# Patient Record
Sex: Male | Born: 1975 | Race: Black or African American | Hispanic: No | Marital: Single | Smoking: Former smoker
Health system: Southern US, Community
[De-identification: ages and names within clinical notes are randomized; demographics above are authoritative.]

## PROBLEM LIST (undated history)

## (undated) DIAGNOSIS — D649 Anemia, unspecified: Secondary | ICD-10-CM

## (undated) HISTORY — DX: Anemia, unspecified: D64.9

---

## 1999-03-15 ENCOUNTER — Emergency Department (HOSPITAL_COMMUNITY): Admission: EM | Admit: 1999-03-15 | Discharge: 1999-03-15 | Payer: Self-pay | Admitting: Emergency Medicine

## 1999-05-14 ENCOUNTER — Emergency Department (HOSPITAL_COMMUNITY): Admission: EM | Admit: 1999-05-14 | Discharge: 1999-05-14 | Payer: Self-pay | Admitting: Emergency Medicine

## 2002-01-06 ENCOUNTER — Encounter: Payer: Self-pay | Admitting: *Deleted

## 2002-01-06 ENCOUNTER — Emergency Department (HOSPITAL_COMMUNITY): Admission: EM | Admit: 2002-01-06 | Discharge: 2002-01-06 | Payer: Self-pay | Admitting: Emergency Medicine

## 2016-05-02 ENCOUNTER — Emergency Department (HOSPITAL_COMMUNITY)
Admission: EM | Admit: 2016-05-02 | Discharge: 2016-05-02 | Disposition: A | Discharge status: Home / Self Care | Payer: BLUE CROSS/BLUE SHIELD | Attending: Emergency Medicine | Admitting: Emergency Medicine

## 2016-05-02 ENCOUNTER — Encounter (HOSPITAL_COMMUNITY): Payer: Self-pay | Admitting: Emergency Medicine

## 2016-05-02 DIAGNOSIS — M79672 Pain in left foot: Secondary | ICD-10-CM

## 2016-05-02 MED ORDER — OXYCODONE-ACETAMINOPHEN 5-325 MG PO TABS: 2.0000 | ORAL_TABLET | ORAL | 0 refills | Status: DC | PRN

## 2016-05-02 MED ORDER — NAPROXEN 500 MG PO TABS: 500.0000 mg | ORAL_TABLET | Freq: Two times a day (BID) | ORAL | 0 refills | Status: DC

## 2016-05-02 NOTE — ED Notes (Addendum)
Patient was alert, oriented and stable upon discharge. RN went over AVS and patient had no further questions. Patient was alert, oriented and stable upon discharge. RN went over AVS and patient had no further questions.  

## 2016-05-02 NOTE — ED Provider Notes (Signed)
[facility] DEPT Provider Note   CSN: [id number] Arrival date & time: 05/02/16  1658   By signing my name below, I, Avnee Patel, attest that this documentation has been prepared under the direction and in the presence of  [employer]. Electronically Signed: Delton Prairie, ED Scribe. 05/02/16. 6:20 PM.   History   Chief Complaint Chief Complaint  Patient presents with  . Foot Pain   The history is provided by the patient. No language interpreter was used.   HPI Comments:  Calvin Andrews is a 41 y.o. male who presents to the Emergency Department complaining of atraumatic, acute onset outer left foot pain onset in the AM today. His pain is worse upon palpation and putting weight on it. 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 He also reports a hx of similar symptoms in the past. No alleviating factors noted. Pt denies any redness, numbness/tingling to his toes, any injuries to the area, previous surgeries to the foot. No other associated symptoms noted.   History reviewed. No pertinent past medical history.  There are no active problems to display for this patient.   No past surgical history on file.   Home Medications    Prior to Admission medications   Medication Sig Start Date End Date Taking? Authorizing Provider  naproxen (NAPROSYN) 500 MG tablet Take 1 tablet (500 mg total) by mouth 2 (two) times daily. 05/02/16   Kinnie Feil, PA-C  oxyCODONE-acetaminophen (PERCOCET/ROXICET) 5-325 MG tablet Take 2 tablets by mouth every 4 (four) hours as needed for severe pain. 05/02/16   Kinnie Feil, PA-C    Family History History reviewed. No pertinent family history.  Social History Social History  Substance Use Topics  . Smoking status: Not on file  . Smokeless tobacco: Not on file  . Alcohol use Not on file     Allergies   Patient has no known allergies.   Review of Systems Review of Systems  Constitutional:  Negative for chills and fever.  HENT: Negative for congestion and sore throat.   Eyes: Negative for visual disturbance.  Respiratory: Negative for cough, chest tightness and shortness of breath.   Cardiovascular: Negative for chest pain.  Gastrointestinal: Negative for abdominal pain, constipation, diarrhea, nausea and vomiting.  Genitourinary: Negative for decreased urine volume and difficulty urinating.  Musculoskeletal: Negative for arthralgias and joint swelling.  Skin: Negative for rash.  Neurological: Negative for dizziness, light-headedness and headaches.     Physical Exam Updated Vital Signs BP 126/88   Pulse 100   Temp 99.6 F (37.6 C) (Oral)   Resp 16   Ht _0  (1.753 m)   Wt 77.1 kg   SpO2 96%   BMI 25.10 kg/m   Physical Exam  Constitutional: He is oriented to person, place, and time. He appears well-developed and well-nourished. No distress.  NAD.  HENT:  Head: Normocephalic and atraumatic.  Right Ear: External ear normal.  Left Ear: External ear normal.  Nose: Nose normal.  Mouth/Throat: Oropharynx is clear and moist. No oropharyngeal exudate.  Eyes: Conjunctivae and EOM are normal. Pupils are equal, round, and reactive to light. No scleral icterus.  Neck: Normal range of motion. Neck supple. No JVD present. No tracheal deviation present.  Cardiovascular: Normal rate, regular rhythm, normal heart sounds and intact distal pulses.   No murmur heard. Pulmonary/Chest: Effort normal and breath sounds normal. He has no wheezes.  Abdominal: Soft. He exhibits no  distension. There is no tenderness.  Musculoskeletal: Normal range of motion. He exhibits edema and tenderness. He exhibits no deformity.  Left foot: Mild edema and tenderness on the lateral aspect of the left mid foot most significant over CTL and ATFL. Positive talar tilt. Non tender Achilles tender, negative Thompson's. Negative anterior/posterior drawer. No calf tenderness. No unilateral lower extremity  edema.   Lymphadenopathy:    He has no cervical adenopathy.  Neurological: He is alert and oriented to person, place, and time.  Foot: sensation to light touch intact in the distribution of the saphenous nerve, medial plantar nerve, lateral plantar nerve, bilaterally.    Skin: Skin is warm and dry. Capillary refill takes less than 2 seconds.  Psychiatric: He has a normal mood and affect. His behavior is normal. Judgment and thought content normal.  Nursing note and vitals reviewed.  ED Treatments / Results  DIAGNOSTIC STUDIES:  Oxygen Saturation is 96% on RA, normal by my interpretation.    COORDINATION OF CARE:  6:07 PM Discussed treatment plan with pt at bedside and pt agreed to plan.  Labs (all labs ordered are listed, but only abnormal results are displayed) Labs Reviewed - No data to display  EKG  EKG Interpretation None       Radiology No results found.  Procedures Procedures (including critical care time)  Medications Ordered in ED Medications - No data to display   Initial Impression / Assessment and Plan / ED Course  I have reviewed the triage vital signs and the nursing notes.  Pertinent labs & imaging results that were available during my care of the patient were reviewed by me and considered in my medical decision making (see chart for details).     Patient present to the ED with atraumatic left foot pain. [place] ankle rules not met, x-ray not indicated.  Suspect ligamentous injury, possibly from overuse.  NV intact.  Pt [place] airport.  Patient given brace while in ED, prescribed short course of Percocet for severe night pain and conservative therapy recommended and discussed. Patient will be discharged home & is agreeable with above plan. Returns precautions discussed. Pt appears safe for discharge.  Final Clinical Impressions(s) / ED Diagnoses   Final diagnoses:  Left foot pain    New Prescriptions Discharge Medication  List as of 05/02/2016  6:33 PM    START taking these medications   Details  naproxen (NAPROSYN) 500 MG tablet Take 1 tablet (500 mg total) by mouth 2 (two) times daily., Starting Mon 05/02/2016, Print    oxyCODONE-acetaminophen (PERCOCET/ROXICET) 5-325 MG tablet Take 2 tablets by mouth every 4 (four) hours as needed for severe pain., Starting Mon 05/02/2016, Print       I personally performed the services described in this documentation, which was scribed in my presence. The recorded information has been reviewed and is accurate.     Kinnie Feil, PA-C 05/02/16 [place], MD 05/03/16 [phone]

## 2016-05-02 NOTE — ED Notes (Signed)
ED Provider at bedside. 

## 2016-05-02 NOTE — Discharge Instructions (Signed)
Please wear your ankle wrap to provide support and decrease swelling.  Make sure you wear adequate shoes to work to prevent further injury to your ligaments.    Please elevate and ice your foot to decrease inflammation, which will decrease pain.  Please take naproxen 500mg  with 650 tylenol 3 times a day for pain and inflammation.  You have a short prescription of percocet for severe pain, you may reserve this for night time as it may cause drowsiness.   Follow up with primary care doctor if your symptoms do not improve or worsen

## 2016-05-02 NOTE — ED Triage Notes (Signed)
Pt states that he woke up this morning with L foot pain. Denies injury. Walks often. Alert and oriented.

## 2016-10-20 DIAGNOSIS — Z813 Family history of other psychoactive substance abuse and dependence: Secondary | ICD-10-CM | POA: Diagnosis not present

## 2016-10-20 DIAGNOSIS — F419 Anxiety disorder, unspecified: Secondary | ICD-10-CM | POA: Diagnosis not present

## 2016-10-20 DIAGNOSIS — F6381 Intermittent explosive disorder: Secondary | ICD-10-CM | POA: Diagnosis not present

## 2016-10-20 DIAGNOSIS — F332 Major depressive disorder, recurrent severe without psychotic features: Secondary | ICD-10-CM | POA: Diagnosis not present

## 2016-10-20 DIAGNOSIS — Z79899 Other long term (current) drug therapy: Secondary | ICD-10-CM | POA: Diagnosis not present

## 2016-10-20 DIAGNOSIS — F333 Major depressive disorder, recurrent, severe with psychotic symptoms: Secondary | ICD-10-CM | POA: Diagnosis not present

## 2016-10-20 DIAGNOSIS — G4700 Insomnia, unspecified: Secondary | ICD-10-CM | POA: Diagnosis not present

## 2016-10-20 DIAGNOSIS — F329 Major depressive disorder, single episode, unspecified: Secondary | ICD-10-CM | POA: Diagnosis not present

## 2016-10-20 DIAGNOSIS — R45851 Suicidal ideations: Secondary | ICD-10-CM | POA: Diagnosis not present

## 2016-10-20 DIAGNOSIS — Z87891 Personal history of nicotine dependence: Secondary | ICD-10-CM | POA: Diagnosis not present

## 2016-10-20 DIAGNOSIS — F323 Major depressive disorder, single episode, severe with psychotic features: Secondary | ICD-10-CM | POA: Diagnosis not present

## 2016-10-21 DIAGNOSIS — F323 Major depressive disorder, single episode, severe with psychotic features: Secondary | ICD-10-CM | POA: Diagnosis not present

## 2016-10-21 DIAGNOSIS — F6381 Intermittent explosive disorder: Secondary | ICD-10-CM | POA: Diagnosis not present

## 2016-10-21 DIAGNOSIS — Z813 Family history of other psychoactive substance abuse and dependence: Secondary | ICD-10-CM | POA: Diagnosis not present

## 2016-10-21 DIAGNOSIS — R45851 Suicidal ideations: Secondary | ICD-10-CM | POA: Diagnosis not present

## 2016-10-22 DIAGNOSIS — F323 Major depressive disorder, single episode, severe with psychotic features: Secondary | ICD-10-CM | POA: Diagnosis not present

## 2016-10-22 DIAGNOSIS — F6381 Intermittent explosive disorder: Secondary | ICD-10-CM | POA: Diagnosis not present

## 2016-10-22 DIAGNOSIS — Z87891 Personal history of nicotine dependence: Secondary | ICD-10-CM | POA: Diagnosis not present

## 2016-10-23 DIAGNOSIS — F323 Major depressive disorder, single episode, severe with psychotic features: Secondary | ICD-10-CM | POA: Diagnosis not present

## 2016-10-23 DIAGNOSIS — Z87891 Personal history of nicotine dependence: Secondary | ICD-10-CM | POA: Diagnosis not present

## 2016-10-23 DIAGNOSIS — F6381 Intermittent explosive disorder: Secondary | ICD-10-CM | POA: Diagnosis not present

## 2016-10-24 DIAGNOSIS — Z87891 Personal history of nicotine dependence: Secondary | ICD-10-CM | POA: Diagnosis not present

## 2016-10-24 DIAGNOSIS — F419 Anxiety disorder, unspecified: Secondary | ICD-10-CM | POA: Diagnosis not present

## 2016-10-24 DIAGNOSIS — F323 Major depressive disorder, single episode, severe with psychotic features: Secondary | ICD-10-CM | POA: Diagnosis not present

## 2016-10-24 DIAGNOSIS — F6381 Intermittent explosive disorder: Secondary | ICD-10-CM | POA: Diagnosis not present

## 2016-10-25 DIAGNOSIS — F6381 Intermittent explosive disorder: Secondary | ICD-10-CM | POA: Diagnosis not present

## 2016-10-25 DIAGNOSIS — F323 Major depressive disorder, single episode, severe with psychotic features: Secondary | ICD-10-CM | POA: Diagnosis not present

## 2016-10-25 DIAGNOSIS — F419 Anxiety disorder, unspecified: Secondary | ICD-10-CM | POA: Diagnosis not present

## 2016-10-25 DIAGNOSIS — Z87891 Personal history of nicotine dependence: Secondary | ICD-10-CM | POA: Diagnosis not present

## 2016-12-22 ENCOUNTER — Emergency Department (HOSPITAL_BASED_OUTPATIENT_CLINIC_OR_DEPARTMENT_OTHER)
Admission: EM | Admit: 2016-12-22 | Discharge: 2016-12-22 | Disposition: A | Discharge status: Home / Self Care | Payer: BLUE CROSS/BLUE SHIELD | Attending: Emergency Medicine | Admitting: Emergency Medicine

## 2016-12-22 ENCOUNTER — Encounter (HOSPITAL_BASED_OUTPATIENT_CLINIC_OR_DEPARTMENT_OTHER): Payer: Self-pay | Admitting: *Deleted

## 2016-12-22 ENCOUNTER — Emergency Department (HOSPITAL_BASED_OUTPATIENT_CLINIC_OR_DEPARTMENT_OTHER): Payer: BLUE CROSS/BLUE SHIELD

## 2016-12-22 DIAGNOSIS — K5289 Other specified noninfective gastroenteritis and colitis: Secondary | ICD-10-CM | POA: Insufficient documentation

## 2016-12-22 DIAGNOSIS — K529 Noninfective gastroenteritis and colitis, unspecified: Secondary | ICD-10-CM

## 2016-12-22 DIAGNOSIS — R109 Unspecified abdominal pain: Secondary | ICD-10-CM | POA: Diagnosis not present

## 2016-12-22 DIAGNOSIS — R1032 Left lower quadrant pain: Secondary | ICD-10-CM | POA: Diagnosis not present

## 2016-12-22 DIAGNOSIS — R197 Diarrhea, unspecified: Secondary | ICD-10-CM

## 2016-12-22 LAB — CBC WITH DIFFERENTIAL/PLATELET
BASOS ABS: 0 10*3/uL (ref 0.0–0.1)
BASOS PCT: 0 %
EOS ABS: 0.1 10*3/uL (ref 0.0–0.7)
Eosinophils Relative: 2 %
HCT: 36 % — ABNORMAL LOW (ref 39.0–52.0)
HEMOGLOBIN: 12.3 g/dL — AB (ref 13.0–17.0)
Lymphocytes Relative: 22 %
Lymphs Abs: 1.7 10*3/uL (ref 0.7–4.0)
MCH: 27.6 pg (ref 26.0–34.0)
MCHC: 34.2 g/dL (ref 30.0–36.0)
MCV: 80.9 fL (ref 78.0–100.0)
Monocytes Absolute: 1.6 10*3/uL — ABNORMAL HIGH (ref 0.1–1.0)
Monocytes Relative: 22 %
NEUTROS PCT: 54 %
Neutro Abs: 4.1 10*3/uL (ref 1.7–7.7)
Platelets: 323 10*3/uL (ref 150–400)
RBC: 4.45 MIL/uL (ref 4.22–5.81)
RDW: 13.8 % (ref 11.5–15.5)
WBC: 7.5 10*3/uL (ref 4.0–10.5)

## 2016-12-22 LAB — COMPREHENSIVE METABOLIC PANEL
ALK PHOS: 42 U/L (ref 38–126)
ALT: 19 U/L (ref 17–63)
ANION GAP: 6 (ref 5–15)
AST: 15 U/L (ref 15–41)
Albumin: 3.6 g/dL (ref 3.5–5.0)
BILIRUBIN TOTAL: 0.5 mg/dL (ref 0.3–1.2)
BUN: 6 mg/dL (ref 6–20)
CALCIUM: 9.1 mg/dL (ref 8.9–10.3)
CO2: 28 mmol/L (ref 22–32)
Chloride: 104 mmol/L (ref 101–111)
Creatinine, Ser: 0.99 mg/dL (ref 0.61–1.24)
GFR calc non Af Amer: >60 mL/min (ref >60)
Glucose, Bld: 101 mg/dL — ABNORMAL HIGH (ref 65–99)
POTASSIUM: 3.3 mmol/L — AB (ref 3.5–5.1)
SODIUM: 138 mmol/L (ref 135–145)
TOTAL PROTEIN: 7.4 g/dL (ref 6.5–8.1)

## 2016-12-22 LAB — URINALYSIS, ROUTINE W REFLEX MICROSCOPIC
BILIRUBIN URINE: NEGATIVE
Glucose, UA: NEGATIVE mg/dL
Ketones, ur: 15 mg/dL — AB
LEUKOCYTES UA: NEGATIVE
NITRITE: NEGATIVE
PROTEIN: NEGATIVE mg/dL
Specific Gravity, Urine: 1.02 (ref 1.005–1.030)
pH: 5.5 (ref 5.0–8.0)

## 2016-12-22 LAB — LIPASE, BLOOD: Lipase: 27 U/L (ref 11–51)

## 2016-12-22 LAB — URINALYSIS, MICROSCOPIC (REFLEX)

## 2016-12-22 LAB — OCCULT BLOOD X 1 CARD TO LAB, STOOL: FECAL OCCULT BLD: POSITIVE — AB

## 2016-12-22 MED ORDER — POTASSIUM CHLORIDE CRYS ER 20 MEQ PO TBCR: 10.0000 meq | EXTENDED_RELEASE_TABLET | Freq: Once | ORAL | Status: DC

## 2016-12-22 MED ORDER — IOPAMIDOL (ISOVUE-300) INJECTION 61%
100.0000 mL | Freq: Once | INTRAVENOUS | Status: AC | PRN
  Administered 2016-12-22: 100 mL via INTRAVENOUS

## 2016-12-22 MED ORDER — SODIUM CHLORIDE 0.9 % IV BOLUS (SEPSIS)
1000.0000 mL | Freq: Once | INTRAVENOUS | Status: AC
  Administered 2016-12-22: 1000 mL via INTRAVENOUS

## 2016-12-22 MED ORDER — POTASSIUM CHLORIDE CRYS ER 20 MEQ PO TBCR
20.0000 meq | EXTENDED_RELEASE_TABLET | Freq: Once | ORAL | Status: AC
  Administered 2016-12-22: 20 meq via ORAL
  Filled 2016-12-22: qty 1

## 2016-12-22 MED ORDER — DICYCLOMINE HCL 20 MG PO TABS: 20.0000 mg | ORAL_TABLET | Freq: Two times a day (BID) | ORAL | 0 refills | Status: AC

## 2016-12-22 MED ORDER — KETOROLAC TROMETHAMINE 30 MG/ML IJ SOLN
30.0000 mg | Freq: Once | INTRAMUSCULAR | Status: AC
  Administered 2016-12-22: 30 mg via INTRAVENOUS
  Filled 2016-12-22: qty 1

## 2016-12-22 MED FILL — DICYCLOMINE 20 MG TABLET: 20 | 10 days supply | Qty: 20 | Fill #0

## 2016-12-22 NOTE — ED Notes (Signed)
Family at bedside. 

## 2016-12-22 NOTE — ED Provider Notes (Signed)
[facility]   CSN: [id number] Arrival date & time: 12/22/16  1101     History   Chief Complaint Chief Complaint  Patient presents with  . Abdominal Pain    HPI Calvin Andrews is a 41 y.o. male who presents with 5 days of LLQ abdominal pain and diarrhea. Patient reports that with his episodes of bowel movements, he has noted some bright red blood with the stools. No rectal bleeding and any other time. He reports that left lower quadrant abdominal pain feels like a "dull ache." He denies any alleviating or aggravating factors. He states that he takes Tums with no improvement. Patient reports that he still been able to eat and drink without any difficulty. He denies any nausea/vomiting. He denies any history of peptic ulcers. He denies any daily NSAID use. Patient does report intermittent cigarette smoking but states that he does not smoke every day. He reports that approximately day before the symptoms started, he ate some ribs that he were concerned undercooked. He states that nobody else at home ate the food. Patient denies any fever, chest pain, SOB, dysuria, hematuria, testicular pain/swelling.  The history is provided by the patient.    History reviewed. No pertinent past medical history.  There are no active problems to display for this patient.   History reviewed. No pertinent surgical history.     Home Medications    Prior to Admission medications   Medication Sig Start Date End Date Taking? Authorizing Provider  dicyclomine (BENTYL) 20 MG tablet Take 1 tablet (20 mg total) by mouth 2 (two) times daily. 12/22/16   Maxwell Caul, PA-C    Family History History reviewed. No pertinent family history.  Social History Social History  Substance Use Topics  . Smoking status: Never Smoker  . Smokeless tobacco: Never Used  . Alcohol use Not on file     Allergies   Patient has no known allergies.   Review of Systems Review of Systems    Constitutional: Negative for fever.  Respiratory: Negative for shortness of breath.   Cardiovascular: Negative for chest pain.  Gastrointestinal: Positive for abdominal pain, blood in stool and diarrhea. Negative for nausea and vomiting.  Genitourinary: Negative for dysuria, hematuria, penile swelling, scrotal swelling and testicular pain.     Physical Exam Updated Vital Signs BP 118/73 (BP Location: Right Arm)   Pulse 92   Temp 98.2 F (36.8 C) (Oral)   Resp 20   Ht  (1.753 m)   Wt 81.6 kg (180 lb)   SpO2 100%   BMI 26.58 kg/m   Physical Exam  Constitutional: He is oriented to person, place, and time. He appears well-developed and well-nourished.  Sitting comfortably on examination table  HENT:  Head: Normocephalic and atraumatic.  Mouth/Throat: Oropharynx is clear and moist and mucous membranes are normal.  Eyes: Pupils are equal, round, and reactive to light. Conjunctivae, EOM and lids are normal.  Neck: Full passive range of motion without pain.  Cardiovascular: Normal rate, regular rhythm, normal heart sounds and normal pulses.  Exam reveals no gallop and no friction rub.   No murmur heard. Pulmonary/Chest: Effort normal and breath sounds normal.  Abdominal: Soft. Normal appearance. Bowel sounds are decreased. There is tenderness in the left lower quadrant. There is no rigidity, no guarding, no CVA tenderness and no tenderness at McBurney's point.  Abdomen is soft, nondistended. He has diffuse left lower quadrant tenderness. No rigidity, guarding. No peritoneal signs. No  CVA tenderness bilaterally.  Genitourinary: Rectum normal.  Genitourinary Comments: The exam was performed with a chaperone present. No external hemorrhoids noted. No mass, tenderness or fluctuance.  Musculoskeletal: Normal range of motion.  Neurological: He is alert and oriented to person, place, and time.  Skin: Skin is warm and dry. Capillary refill takes less than 2 seconds.  Psychiatric: He has  a normal mood and affect. His speech is normal.  Nursing note and vitals reviewed.    ED Treatments / Results  Labs (all labs ordered are listed, but only abnormal results are displayed) Labs Reviewed  COMPREHENSIVE METABOLIC PANEL - Abnormal; Notable for the following:       Result Value   Potassium 3.3 (*)    Glucose, Bld 101 (*)    All other components within normal limits  CBC WITH DIFFERENTIAL/PLATELET - Abnormal; Notable for the following:    Hemoglobin 12.3 (*)    HCT 36.0 (*)    Monocytes Absolute 1.6 (*)    All other components within normal limits  URINALYSIS, ROUTINE W REFLEX MICROSCOPIC - Abnormal; Notable for the following:    Hgb urine dipstick TRACE (*)    Ketones, ur 15 (*)    All other components within normal limits  OCCULT BLOOD X 1 CARD TO LAB, STOOL - Abnormal; Notable for the following:    Fecal Occult Bld POSITIVE (*)    All other components within normal limits  URINALYSIS, MICROSCOPIC (REFLEX) - Abnormal; Notable for the following:    Bacteria, UA RARE (*)    Squamous Epithelial / LPF 0-5 (*)    All other components within normal limits  GASTROINTESTINAL PANEL BY PCR, STOOL (REPLACES STOOL CULTURE)  LIPASE, BLOOD    EKG  EKG Interpretation None       Radiology Ct Abdomen Pelvis W Contrast  Result Date: 12/22/2016 CLINICAL DATA:  Abdominal pain and diarrhea for several days EXAM: CT ABDOMEN AND PELVIS WITH CONTRAST TECHNIQUE: Multidetector CT imaging of the abdomen and pelvis was performed using the standard protocol following bolus administration of intravenous contrast. CONTRAST:  ISOVUE-300 IOPAMIDOL (ISOVUE-300) INJECTION 61% COMPARISON:  None. FINDINGS: Lower chest: No acute abnormality. Hepatobiliary: No focal liver abnormality is seen. No gallstones, gallbladder wall thickening, or biliary dilatation. Pancreas: Unremarkable. No pancreatic ductal dilatation or surrounding inflammatory changes. Spleen: Normal in size without focal  abnormality. Adrenals/Urinary Tract: Adrenal glands are unremarkable. Kidneys are normal, without renal calculi, focal lesion, or hydronephrosis. Bladder is decompressed. Stomach/Bowel: The appendix is within normal limits. The colon is predominately decompressed with some mild hyperemia identified which may represent some generalized colitis. No findings to suggest small bowel dilatation are seen. Vascular/Lymphatic: Abdominal aorta is within normal limits. Scattered small mesenteric lymph nodes are seen likely of reactive nature. Reproductive: Prostate is unremarkable. Other: No abdominal wall hernia or abnormality. No abdominopelvic ascites. Musculoskeletal: No acute or significant osseous findings. IMPRESSION: Decompression of the colon with mild hyperemia and some mild surrounding inflammatory changes and likely reactive mesenteric adenopathy. These changes are likely related to a generalized colitis. No definitive small bowel dilatation is seen. No other focal abnormality is noted. Electronically Signed   By: Alcide Clever M.D.   On: 12/22/2016 13:37   Dg Abdomen Acute W/chest  Result Date: 12/22/2016 CLINICAL DATA:  Abdominal pain and diarrhea. EXAM: DG ABDOMEN ACUTE W/ 1V CHEST COMPARISON:  None. FINDINGS: There are a few mildly dilated loops of small bowel and air-fluid levels. No free intraperitoneal air. No radiopaque calculi or other  significant radiographic abnormality is seen. Heart size and mediastinal contours are within normal limits. Both lungs are clear. IMPRESSION: 1. There are a few mildly dilated loops of small bowel and air-fluid levels, which could reflect early small bowel obstruction. Consider CT for further evaluation as clinically indicated. 2. No acute cardiopulmonary disease. Electronically Signed   By: Obie Dredge M.D.   On: 12/22/2016 12:42    Procedures Procedures (including critical care time)  Medications Ordered in ED Medications  sodium chloride 0.9 % bolus 1,000  mL (0 mLs Intravenous Stopped 12/22/16 1411)  iopamidol (ISOVUE-300) 61 % injection 100 mL (100 mLs Intravenous Contrast Given 12/22/16 1319)  ketorolac (TORADOL) 30 MG/ML injection 30 mg (30 mg Intravenous Given 12/22/16 1540)  potassium chloride SA (K-DUR,KLOR-CON) CR tablet 20 mEq (20 mEq Oral Given 12/22/16 1609)     Initial Impression / Assessment and Plan / ED Course  I have reviewed the triage vital signs and the nursing notes.  Pertinent labs & imaging results that were available during my care of the patient were reviewed by me and considered in my medical decision making (see chart for details).     41 y.o. M who presents with 5 days of left lower quadrant abdominal pain and intermittent blood in stool. Associated with diarrhea. No nausea/vomiting or fevers. Patient is afebrile, non-toxic appearing, sitting comfortably on examination table. Vital signs reviewed and stable. His exam shows diffuse tenderness to left lower quadrant. Consider diverticulitis versus acute infectious etiology. Also consider GI bleed, though low risk. Plan to check basic labs including CBC, CMP, UA, lipase. Also will obtain acute abdomen x-ray for evaluation of any perforation. Offerred patient analgesics but he declined at this time.  Labs and imaging reviewed. Urinalysis shows trace hemoglobin and ketones. Fecal occult is positive for blood. Lipase within normal limits. CBC shows slight anemia with hemoglobin of 12.3 and hematocrit 36.0. No priors for comparison. CMP shows slight hypokalemia but otherwise unremarkable. Abdominal series shows few mildly dilated loops of small bowel and air-fluid levels and recommends further CT evaluation. Discussed results with patient. Will plan to do a CT abdomen/pelvis for further evaluation.  CT abdomen and pelvis reviewed. Shows the compression of the colon with some mild surrounding inflammatory changes, likely reactive to mesenteric adenopathy. Given results and findings,  we'll plan to consult GI for further evaluation.  Discussed with Dr. Marina Goodell ([facility]) and findings. Suspect symptoms are likely to be a result of acute colitis. Recommends giving pancultures of stools and follow-up with his primary care doctor. Also recommend further follow-up with GI if symptoms do not improve.  Discussed plan with patient. He is agreeable. Will attempt to provide a stool culture here in the department. Will plan to treat symptomatically. Encourage plan diet until symptoms improve. Patient has an appointment scheduled with the clinic at [facility] on Monday at 3 PM. Encouraged him to keep that appointment as directed. Also provided a list of clinics that he can follow-up with. Also provided referral for outpatient GI. Strict return precautions discussed. Patient expresses understanding and agreement to plan.     Final Clinical Impressions(s) / ED Diagnoses   Final diagnoses:  Left lower quadrant pain  Diarrhea, unspecified type  Colitis    New Prescriptions Discharge Medication List as of 12/22/2016  4:05 PM    START taking these medications   Details  dicyclomine (BENTYL) 20 MG tablet Take 1 tablet (20 mg total) by mouth 2 (two) times daily., Starting Thu  12/22/2016, Print         Maxwell Caul, PA-C 12/22/16 1659    Tilden Fossa, MD 12/24/16 1455

## 2016-12-22 NOTE — ED Triage Notes (Signed)
Pt reports 5 days of abd pain and diarrhea with bright red blood only with stools, no bleeding between stools. Denies any n/v/fevers or other c/o.

## 2016-12-22 NOTE — Discharge Instructions (Signed)
Make sure you to give plenty of fluids and staying hydrated.  Follow the referred diet for improvement in symptoms.  Take Bentyl as needed for pain.   Keep the appointment you have with the primary care doctor on Monday. If you cannot get in with them I have provided you some referrals.   Follow-up with the referred GI doctor if symptoms do not improve in a week.   Return the emergency department for any worsening pain, fever, persistent vomiting, worsening blood in stool, chest pain, difficulty breathing or any other worsening or concerning symptoms.

## 2016-12-23 ENCOUNTER — Telehealth: Payer: Self-pay | Admitting: Behavioral Health

## 2016-12-23 ENCOUNTER — Encounter: Payer: Self-pay | Admitting: Behavioral Health

## 2016-12-23 LAB — GASTROINTESTINAL PANEL BY PCR, STOOL (REPLACES STOOL CULTURE)

## 2016-12-23 NOTE — Telephone Encounter (Signed)
Pre-Visit Call completed with patient and chart updated.   Pre-Visit Info documented in Specialty Comments under [employer].    

## 2016-12-26 ENCOUNTER — Ambulatory Visit (INDEPENDENT_AMBULATORY_CARE_PROVIDER_SITE_OTHER): Payer: BLUE CROSS/BLUE SHIELD | Admitting: Medical

## 2016-12-26 ENCOUNTER — Ambulatory Visit (HOSPITAL_BASED_OUTPATIENT_CLINIC_OR_DEPARTMENT_OTHER)
Admission: RE | Admit: 2016-12-26 | Discharge: 2016-12-26 | Disposition: A | Discharge status: Home / Self Care | Payer: BLUE CROSS/BLUE SHIELD | Source: Ambulatory Visit | Attending: Medical | Admitting: Medical

## 2016-12-26 ENCOUNTER — Encounter: Payer: Self-pay | Admitting: Medical

## 2016-12-26 VITALS — BP 112/62 | HR 99 | Temp 99.4°F | Resp 16 | Ht 69.0 in | Wt 173.0 lb

## 2016-12-26 DIAGNOSIS — R1032 Left lower quadrant pain: Secondary | ICD-10-CM

## 2016-12-26 DIAGNOSIS — K921 Melena: Secondary | ICD-10-CM | POA: Diagnosis not present

## 2016-12-26 DIAGNOSIS — R140 Abdominal distension (gaseous): Secondary | ICD-10-CM | POA: Diagnosis not present

## 2016-12-26 DIAGNOSIS — R109 Unspecified abdominal pain: Secondary | ICD-10-CM | POA: Diagnosis not present

## 2016-12-26 MED ORDER — METRONIDAZOLE 500 MG PO TABS: 500.0000 mg | ORAL_TABLET | Freq: Three times a day (TID) | ORAL | 0 refills | Status: DC

## 2016-12-26 MED ORDER — CIPROFLOXACIN HCL 500 MG PO TABS: 500.0000 mg | ORAL_TABLET | Freq: Two times a day (BID) | ORAL | 0 refills | Status: DC

## 2016-12-26 NOTE — Progress Notes (Signed)
Subjective:    Patient ID: Calvin Andrews, male    DOB: [birth year], 41 y.o.   MRN: [id number]  HPI    Pt is here for first time and for ED follow up.  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 Pt drinks alcohol 2-3 beers 3 days a week, smokes black and mild 2-3 a week. No caffeinated beverage. girlfirend with him today.   Pt in for left lower quadrant quadrant region pain. He went to the ED and had work up. He states he is about the same. Pt states he has not been eating much and loosing some weight. Pt and girlfriedn state he had diarrhea/watery loose stools for about 2 weeks. Some bright red blood mixed in with stools. No hx of peptic ulcer. No use of nsaids. Does smoke intermittently.  Some pain in left lower quadrant that comes and goes with eating. Pt feels a little gasy and bloated. Some loose stools with intermittent blood seen.  Pt had work up in ED. Given bentyl. ED notes read as below.(labs review and ct reviewed.) Pt stool panel review and negative 4 days ago.   From ED 41 y.o. M who presents with 5 days of left lower quadrant abdominal pain and intermittent blood in stool. Associated with diarrhea. No nausea/vomiting or fevers. Patient is afebrile, non-toxic appearing, sitting comfortably on examination table. Vital signs reviewed and stable. His exam shows diffuse tenderness to left lower quadrant. Consider diverticulitis versus acute infectious etiology. Also consider GI bleed, though low risk. Plan to check basic labs including CBC, CMP, UA, lipase. Also will obtain acute abdomen x-ray for evaluation of any perforation. Offerred patient analgesics but he declined at this time.  Labs and imaging reviewed. Urinalysis shows trace hemoglobin and ketones. Fecal occult is positive for blood. Lipase within normal limits. CBC shows slight anemia with hemoglobin of 12.3 and hematocrit  36.0. No priors for comparison. CMP shows slight hypokalemia but otherwise unremarkable. Abdominal series shows few mildly dilated loops of small bowel and air-fluid levels and recommends further CT evaluation. Discussed results with patient. Will plan to do a CT abdomen/pelvis for further evaluation.  CT abdomen and pelvis reviewed. Shows the compression of the colon with some mild surrounding inflammatory changes, likely reactive to mesenteric adenopathy. Given results and findings, we'll plan to consult GI for further evaluation.  Discussed with Dr. Marina Goodell ([facility]) and findings. Suspect symptoms are likely to be a result of acute colitis. Recommends giving pancultures of stools and follow-up with his primary care doctor. Also recommend further follow-up with GI if symptoms do not improve.  Discussed plan with patient. He is agreeable. Will attempt to provide a stool culture here in the department. Will plan to treat symptomatically. Encourage plan diet until symptoms improve. Patient has an appointment scheduled with the clinic at [facility] on Monday at 3 PM. Encouraged him to keep that appointment as directed. Also provided a list of clinics that he can follow-up with. Also provided referral for outpatient GI. Strict return precautions discussed. Patient expresses understanding and agreement to plan.  End of ED summary      Review of Systems  Constitutional: Positive for fatigue. Negative for chills and fever.       Some fatigue.  HENT: Negative for congestion, ear discharge, ear pain, hearing loss, mouth sores, nosebleeds, postnasal drip, rhinorrhea, sinus pain and sinus pressure.  Respiratory: Negative for cough, chest tightness, shortness of breath and wheezing.   Cardiovascular: Negative for chest pain and palpitations.  Gastrointestinal: Positive for abdominal distention and abdominal pain. Negative for blood in stool, constipation, diarrhea and nausea.       Some bright  red blood in stool yesterday and some today in am as well.  Musculoskeletal: Negative for back pain.  Skin: Negative for rash.  Neurological: Negative for dizziness, syncope, speech difficulty, weakness, light-headedness and numbness.  Hematological: Negative for adenopathy. Does not bruise/bleed easily.  Psychiatric/Behavioral: Negative for behavioral problems, confusion, dysphoric mood, hallucinations and suicidal ideas. The patient is not nervous/anxious.     No past medical history on file.   Social History   Social History  . Marital status: Single    Spouse name: N/A  . Number of children: N/A  . Years of education: N/A   Occupational History  . Not on file.   Social History Main Topics  . Smoking status: Never Smoker  . Smokeless tobacco: Never Used  . Alcohol use Not on file  . Drug use: Unknown  . Sexual activity: Not on file   Other Topics Concern  . Not on file   Social History Narrative  . No narrative on file    No past surgical history on file.  No family history on file.  No Known Allergies  Current Outpatient Prescriptions on File Prior to Visit  Medication Sig Dispense Refill  . dicyclomine (BENTYL) 20 MG tablet Take 1 tablet (20 mg total) by mouth 2 (two) times daily. 20 tablet 0   No current facility-administered medications on file prior to visit.     BP 112/62   Pulse 99   Temp 99.4 F (37.4 C) (Oral)   Resp 16   Ht  (1.753 m)   Wt 173 lb (78.5 kg)   SpO2 100%   BMI 25.55 kg/m      Objective:   Physical Exam   General Appearance- Not in acute distress.  HEENT Eyes- Scleraeral/Conjuntiva-bilat- Not Yellow. Mouth & Throat- Normal.  Chest and Lung Exam Auscultation: Breath sounds:-Normal. Adventitious sounds:- No Adventitious sounds.  Cardiovascular Auscultation:Rythm - Regular. Heart Sounds -Normal heart sounds.  Abdomen Inspection:-Inspection Normal.  Palpation/Perucssion: Palpation and Percussion of the abdomen  reveal- left lower quadrant Tender(minmal distended appearance), No Rebound tenderness, No rigidity(Guarding) and No Palpable abdominal masses.  Liver:-Normal.  Spleen:- Normal.   Rectal Deferred since bright red blood seen this am and he declined. Some again earlier but last bm showed no bright red blood.      Assessment & Plan:  For your recent abdominal discomfort, left lower quadrant pain, blood in stool and mild inflammatory changes on CT, I will get repeat labs CBC, CMP and lipase.  I want to make sure your labs are stable in light of persistent symptoms.   Also decided to get one view abdomen x-ray since on exam your abdomen feels a little distended. Prior x-ray showed some dilated loops of bowel no one to make sure that this is not the case presently.   Presently deciding not to do CT of abdomen/pelvis. But I am going to go ahead and put in referral to GI and asked that they see you ASAP. I will try to point out that emergency department already consult with them on the date is ED service.   Work excuse for tomorrow.   For loose stools you can take  bentyl or Imodium A-D.(But I don't want  to do that just yet since I want to review x-ray as an results first.)   if x-ray is normal and no constipation noted then would advise either using either one but not both.  With left lower quadrant tenderness and inflammatory changes on the CT, I'm going to prescribe you short course of Cipro and Flagyl.   If we can get you in with GI soon, I think the next step would probably be colonoscopy.  If any severe recurrent abdomen pain then do recommend repeat evaluation at the emergency department.  Follow-up with me this Friday or Monday reevaluation. Or as needed.  Merrell Rettinger, Ramon Dredge, PA-C

## 2016-12-26 NOTE — Patient Instructions (Addendum)
For your recent abdominal discomfort, left lower quadrant pain, blood in stool and mild inflammatory changes on CT, I will get repeat labs CBC, CMP and lipase.  I want to make sure your labs are stable in light of persistent symptoms.   Also decided to get one view abdomen x-ray since on exam your abdomen feels a little distended. Prior x-ray showed some dilated loops of bowel no one to make sure that this is not the case presently.   Presently deciding not to do CT of abdomen/pelvis. But I am going to go ahead and put in referral to GI and asked that they see you ASAP. I will try to point out that emergency department already consult with them on the date is ED service.   Work excuse for tomorrow.   For loose stools you can take  bentyl or Imodium A-D.(But I don't want to do that just yet since I want to review x-ray  results first.)   if x-ray is normal and no constipation noted then would advise either using either one but not both.  With left lower quadrant tenderness and inflammatory changes on the CT, I'm going to prescribe you short course of Cipro and Flagyl.   If we can get you in with GI soon, I think the next step would probably be colonoscopy.  If any severe recurrent abdomen pain then do recommend repeat evaluation at the emergency department.  Follow-up with me this Friday or Monday reevaluation. Or as needed.

## 2016-12-27 ENCOUNTER — Telehealth: Payer: Self-pay | Admitting: Internal Medicine

## 2016-12-27 ENCOUNTER — Telehealth: Payer: Self-pay | Admitting: Medical

## 2016-12-27 LAB — COMPREHENSIVE METABOLIC PANEL
ALBUMIN: 3.5 g/dL (ref 3.5–5.2)
ALT: 14 U/L (ref 0–53)
AST: 11 U/L (ref 0–37)
Alkaline Phosphatase: 39 U/L (ref 39–117)
BILIRUBIN TOTAL: 0.6 mg/dL (ref 0.2–1.2)
BUN: 5 mg/dL — AB (ref 6–23)
CALCIUM: 8.8 mg/dL (ref 8.4–10.5)
CO2: 32 mEq/L (ref 19–32)
CREATININE: 0.88 mg/dL (ref 0.40–1.50)
Chloride: 100 mEq/L (ref 96–112)
GFR: 124.09 mL/min (ref >60.00)
Glucose, Bld: 101 mg/dL — ABNORMAL HIGH (ref 70–99)
Potassium: 3.1 mEq/L — ABNORMAL LOW (ref 3.5–5.1)
SODIUM: 138 meq/L (ref 135–145)
Total Protein: 6.4 g/dL (ref 6.0–8.3)

## 2016-12-27 LAB — CBC WITH DIFFERENTIAL/PLATELET
BASOS PCT: 0.8 % (ref 0.0–3.0)
Basophils Absolute: 0.1 10*3/uL (ref 0.0–0.1)
EOS ABS: 0.1 10*3/uL (ref 0.0–0.7)
EOS PCT: 1.4 % (ref 0.0–5.0)
HCT: 35.6 % — ABNORMAL LOW (ref 39.0–52.0)
HEMOGLOBIN: 11.7 g/dL — AB (ref 13.0–17.0)
Lymphocytes Relative: 19.2 % (ref 12.0–46.0)
Lymphs Abs: 1.6 10*3/uL (ref 0.7–4.0)
MCHC: 32.7 g/dL (ref 30.0–36.0)
MCV: 84.2 fl (ref 78.0–100.0)
MONO ABS: 1.6 10*3/uL — AB (ref 0.1–1.0)
Monocytes Relative: 18.6 % — ABNORMAL HIGH (ref 3.0–12.0)
NEUTROS ABS: 5.1 10*3/uL (ref 1.4–7.7)
Neutrophils Relative %: 60 % (ref 43.0–77.0)
PLATELETS: 416 10*3/uL — AB (ref 150.0–400.0)
RBC: 4.24 Mil/uL (ref 4.22–5.81)
RDW: 13.5 % (ref 11.5–15.5)
WBC: 8.5 10*3/uL (ref 4.0–10.5)

## 2016-12-27 LAB — LIPASE: Lipase: 15 U/L (ref 11.0–59.0)

## 2016-12-27 MED ORDER — POTASSIUM CHLORIDE ER 10 MEQ PO TBCR: EXTENDED_RELEASE_TABLET | ORAL | 0 refills | Status: DC

## 2016-12-27 NOTE — Telephone Encounter (Signed)
Pt scheduled to see Hyacinth Meeker PA 12/28/16@2 :45pm. Please notify pt of appt date and time.

## 2016-12-27 NOTE — Telephone Encounter (Signed)
Patient has been notified of appointment 12/28/16 at 2:45pm and has accepted.

## 2016-12-27 NOTE — Telephone Encounter (Signed)
Patient's hemoglobin/hematocrit(anemia). Did decrease. He continues to have intermittent of blood seen in his stools. I referred to GI the other day and requested he be seen ASAP. Please refer to that prior referral and see if they will get him in quickly. Please assess to review/note his recent metabolic panel and CBC. Let me know what they say please.

## 2016-12-27 NOTE — Telephone Encounter (Signed)
Patient has 2 weeks of intermittent left lower quadrant pain with, loose watery stools and intermittent bright red blood in stools. His stool panel studies were negative. Minimal anemia on labs. X-ray abdomen in ED showed some dilated bowels but then later CT did not show obstruction but some inflammatory changes seen. Patient's symptoms are persisting. Minimal to no improvement since the ED evaluation. Repeating some labs today. ED consulted with Dr. On date of emergency department evaluation. Can GI see him ASAP/possibly this week as his symptoms are still persisting.

## 2016-12-28 ENCOUNTER — Ambulatory Visit: Payer: BLUE CROSS/BLUE SHIELD | Admitting: Physician Assistant

## 2016-12-28 NOTE — Telephone Encounter (Signed)
Patient is scheduled for today 12/28/16

## 2017-01-02 ENCOUNTER — Other Ambulatory Visit (INDEPENDENT_AMBULATORY_CARE_PROVIDER_SITE_OTHER): Payer: BLUE CROSS/BLUE SHIELD

## 2017-01-02 ENCOUNTER — Other Ambulatory Visit: Payer: Self-pay | Admitting: Emergency Medicine

## 2017-01-02 ENCOUNTER — Ambulatory Visit (INDEPENDENT_AMBULATORY_CARE_PROVIDER_SITE_OTHER): Payer: BLUE CROSS/BLUE SHIELD | Admitting: Physician Assistant

## 2017-01-02 ENCOUNTER — Encounter: Payer: Self-pay | Admitting: Physician Assistant

## 2017-01-02 VITALS — BP 110/60 | HR 106 | Ht 69.0 in | Wt 169.0 lb

## 2017-01-02 DIAGNOSIS — R197 Diarrhea, unspecified: Secondary | ICD-10-CM

## 2017-01-02 DIAGNOSIS — R935 Abnormal findings on diagnostic imaging of other abdominal regions, including retroperitoneum: Secondary | ICD-10-CM

## 2017-01-02 DIAGNOSIS — K921 Melena: Secondary | ICD-10-CM | POA: Diagnosis not present

## 2017-01-02 LAB — C-REACTIVE PROTEIN: CRP: 4.4 mg/dL (ref 0.5–20.0)

## 2017-01-02 LAB — HEMOGLOBIN: HEMOGLOBIN: 10.8 g/dL — AB (ref 13.0–17.0)

## 2017-01-02 LAB — SEDIMENTATION RATE: Sed Rate: 53 mm/hr — ABNORMAL HIGH (ref 0–15)

## 2017-01-02 MED ORDER — NA SULFATE-K SULFATE-MG SULF 17.5-3.13-1.6 GM/177ML PO SOLN: 1.0000 | ORAL | 0 refills | Status: AC

## 2017-01-02 MED ORDER — METRONIDAZOLE 500 MG PO TABS: 500.0000 mg | ORAL_TABLET | Freq: Three times a day (TID) | ORAL | 0 refills | Status: AC

## 2017-01-02 MED ORDER — CIPROFLOXACIN HCL 500 MG PO TABS: 500.0000 mg | ORAL_TABLET | Freq: Two times a day (BID) | ORAL | 0 refills | Status: DC

## 2017-01-02 NOTE — Patient Instructions (Signed)
Take your Flagyl 500 mg three times a day for 10 days. Take your Cipro 500 mg twice a day for 10 days.   Your physician has requested that you go to the basement for lab work before leaving today.  You have been scheduled for a colonoscopy. Please follow written instructions given to you at your visit today.  Please pick up your prep supplies at the pharmacy within the next 1-3 days. If you use inhalers (even only as needed), please bring them with you on the day of your procedure. Your physician has requested that you go to www.startemmi.com and enter the access code given to you at your visit today. This web site gives a general overview about your procedure. However, you should still follow specific instructions given to you by our office regarding your preparation for the procedure.

## 2017-01-02 NOTE — Progress Notes (Addendum)
Chief Complaint: Abdominal pain, hematochezia, abnormal CT of the abdomen  HPI:  Mr. Calvin Andrews is a 41 year old African-American male with a past medical history as listed below, who was referred to me by Mackie Pai, PA-C for a complaint of abdominal pain, hematochezia and abnormal CT the abdomen.     Patient was seen in the ER 12/22/16 for left lower quadrant abdominal pain and diarrhea. At that time the patient also describes some bright red blood with his bowel movements. Labs at that time showed a CMP with a potassium low at 3.3, CBC with hemoglobin 12.3, normal urinalysis and fecal occult blood positive stool. Patient had gastrointestinal panel which was negative/normal. CT of the abdomen and pelvis showed decompression of the colon with mild hyperemia and some mild surrounding inflammatory changes and likely reactive mesenteric adenopathy. These changes were likely related to generalized colitis. Dr. Henrene Pastor was called as he was physician on call and discussed that  the symptoms were likely acute colitis. Recommended he have pan cultures of stools and follow up with his PCP. If symptoms did not improve  follow GI.    Patient's PCP 12/26/16 and continued with symptoms. Apparently he was started on a short course of Cipro and Flagyl.    Today, the patient presents to clinic accompanied by his girlfriend and explains that about 3 weeks ago "out of the blue" he started with a left lower quadrant abdominal pain which now radiates all the way across his lower abdomen and diarrhea that had bright red blood in it. The patient tells me that he has sometimes 10 loose watery stools per day that all have some bright red blood. This has been going on daily. There has been no change of Bentyl. Patient tells me he never received any antibiotics for these symptoms. He denies a family history of IBD or similar symptoms in the past.   Patient denies fever, chills, sick contacts, anorexia, nausea, vomiting, heartburn,  reflux or symptoms that awaken him at night.  Past Medical History:  Diagnosis Date  . Anemia     No past surgical history on file.  Current Outpatient Prescriptions  Medication Sig Dispense Refill  . ciprofloxacin (CIPRO) 500 MG tablet Take 1 tablet (500 mg total) by mouth 2 (two) times daily. 14 tablet 0  . dicyclomine (BENTYL) 20 MG tablet Take 1 tablet (20 mg total) by mouth 2 (two) times daily. 20 tablet 0  . metroNIDAZOLE (FLAGYL) 500 MG tablet Take 1 tablet (500 mg total) by mouth 3 (three) times daily. 21 tablet 0  . potassium chloride (K-DUR) 10 MEQ tablet 1 tablet by mouth daily 7 tablet 0   No current facility-administered medications for this visit.     Allergies as of 01/02/2017  . (No Known Allergies)    Family History  Problem Relation Age of Onset  . Dementia Father   . Diabetes Brother     Social History   Social History  . Marital status: Single    Spouse name: N/A  . Number of children: N/A  . Years of education: N/A   Occupational History  . Not on file.   Social History Main Topics  . Smoking status: Current Some Day Smoker  . Smokeless tobacco: Never Used  . Alcohol use Yes  . Drug use: No  . Sexual activity: Yes   Other Topics Concern  . Not on file   Social History Narrative  . No narrative on file    Review of Systems:  Constitutional: No weight loss, fever or chills Skin: No rash  Cardiovascular: No chest pain Respiratory: No SOB  Gastrointestinal: See HPI and otherwise negative Genitourinary: No dysuria  Neurological: No headache Musculoskeletal: No new muscle or joint pain Hematologic: No bruising Psychiatric: No history of depression or anxiety   Physical Exam:  Vital signs: BP 110/60   Pulse (!) 106   Ht '5\' 9"'$  (1.753 m)   Wt 169 lb (76.7 kg)   BMI 24.96 kg/m    Constitutional:   Pleasant AA male appears to be in NAD, Well developed, Well nourished, alert and cooperative Head:  Normocephalic and  atraumatic. Eyes:   PEERL, EOMI. No icterus. Conjunctiva pink. Ears:  Normal auditory acuity. Neck:  Supple Throat: Oral cavity and pharynx without inflammation, swelling or lesion.  Respiratory: Respirations even and unlabored. Lungs clear to auscultation bilaterally.   No wheezes, crackles, or rhonchi.  Cardiovascular: Normal S1, S2. No MRG. Regular rate and rhythm. No peripheral edema, cyanosis or pallor.  Gastrointestinal:  Soft, nondistended,LLQ abdominal pain No rebound or guarding. Normal bowel sounds. No appreciable masses or hepatomegaly. Rectal:  Not performed.  Msk:  Symmetrical without gross deformities. Without edema, no deformity or joint abnormality.  Neurologic:  Alert and  oriented x4;  grossly normal neurologically.  Skin:   Dry and intact without significant lesions or rashes. Psychiatric: Demonstrates good judgement and reason without abnormal affect or behaviors.  RELEVANT LABS AND IMAGING: CBC    Component Value Date/Time   WBC 8.5 12/26/2016 1655   RBC 4.24 12/26/2016 1655   HGB 11.7 (L) 12/26/2016 1655   HCT 35.6 (L) 12/26/2016 1655   PLT 416.0 (H) 12/26/2016 1655   MCV 84.2 12/26/2016 1655   MCH 27.6 12/22/2016 1231   MCHC 32.7 12/26/2016 1655   RDW 13.5 12/26/2016 1655   LYMPHSABS 1.6 12/26/2016 1655   MONOABS 1.6 (H) 12/26/2016 1655   EOSABS 0.1 12/26/2016 1655   BASOSABS 0.1 12/26/2016 1655    CMP     Component Value Date/Time   NA 138 12/26/2016 1655   K 3.1 (L) 12/26/2016 1655   CL 100 12/26/2016 1655   CO2 32 12/26/2016 1655   GLUCOSE 101 (H) 12/26/2016 1655   BUN 5 (L) 12/26/2016 1655   CREATININE 0.88 12/26/2016 1655   CALCIUM 8.8 12/26/2016 1655   PROT 6.4 12/26/2016 1655   ALBUMIN 3.5 12/26/2016 1655   AST 11 12/26/2016 1655   ALT 14 12/26/2016 1655   ALKPHOS 39 12/26/2016 1655   BILITOT 0.6 12/26/2016 1655   GFRNONAA >60 12/22/2016 1231   GFRAA >60 12/22/2016 1231   Ct Abdomen Pelvis W Contrast 12/22/16  Result Date:  12/22/2016 CLINICAL DATA:  Abdominal pain and diarrhea for several days EXAM: CT ABDOMEN AND PELVIS WITH CONTRAST TECHNIQUE: Multidetector CT imaging of the abdomen and pelvis was performed using the standard protocol following bolus administration of intravenous contrast. CONTRAST:  133m ISOVUE-300 IOPAMIDOL (ISOVUE-300) INJECTION 61% COMPARISON:  None. FINDINGS: Lower chest: No acute abnormality. Hepatobiliary: No focal liver abnormality is seen. No gallstones, gallbladder wall thickening, or biliary dilatation. Pancreas: Unremarkable. No pancreatic ductal dilatation or surrounding inflammatory changes. Spleen: Normal in size without focal abnormality. Adrenals/Urinary Tract: Adrenal glands are unremarkable. Kidneys are normal, without renal calculi, focal lesion, or hydronephrosis. Bladder is decompressed. Stomach/Bowel: The appendix is within normal limits. The colon is predominately decompressed with some mild hyperemia identified which may represent some generalized colitis. No findings to suggest small bowel dilatation are seen. Vascular/Lymphatic: Abdominal  aorta is within normal limits. Scattered small mesenteric lymph nodes are seen likely of reactive nature. Reproductive: Prostate is unremarkable. Other: No abdominal wall hernia or abnormality. No abdominopelvic ascites. Musculoskeletal: No acute or significant osseous findings. IMPRESSION: Decompression of the colon with mild hyperemia and some mild surrounding inflammatory changes and likely reactive mesenteric adenopathy. These changes are likely related to a generalized colitis. No definitive small bowel dilatation is seen. No other focal abnormality is noted. Electronically Signed   By: Inez Catalina M.D.   On: 12/22/2016 13:37   Assessment: 1. Abnormal CT of the abdomen: Showing colitis, question infectious versus viral versus IBD, patient has not been tried on antibiotics yet 2. Diarrhea: For the past 3 weeks,<10 stools per day 3.  Hematochezia: For the past 3 weeks with diarrhea above  Plan: 1. Prescribed Ciprofloxacin 500 mg twice a day 10 days #20 2. Prescribe Flagyl 500 mg 3 times a day 10 days #30 3. Would recommend a low fiber/ low residue diet for the next 2 weeks 4. Ordered labs including a CRP, ESR and hemoglobin 5. Ordered a C. difficile as well as O&P 6. Patient was scheduled for a colonoscopy today in 4 weeks with Dr. Tonye Royalty. If any of the lab testing above returns positive or patient's symptoms abate with antibiotics we will cancel this procedure. 7. Patient to follow in clinic per recommendations after testing  Ellouise Newer, PA-C [facility] 01/02/2017, 1:45 PM  Cc: Mackie Pai, PA-C   Addendum: Reviewed and agree with initial management. Pyrtle, Lajuan Lines, MD

## 2017-01-03 LAB — OVA AND PARASITE EXAMINATION
CONCENTRATE RESULT:: NONE SEEN
SPECIMEN QUALITY:: ADEQUATE
TRICHROME RESULT:: NONE SEEN
VKL: 81085593

## 2017-01-03 LAB — CLOSTRIDIUM DIFFICILE BY PCR: CDIFFPCR: DETECTED — AB

## 2017-01-04 ENCOUNTER — Other Ambulatory Visit: Payer: Self-pay

## 2017-01-04 MED ORDER — VANCOMYCIN HCL 125 MG PO CAPS: 125.0000 mg | ORAL_CAPSULE | Freq: Four times a day (QID) | ORAL | 0 refills | Status: AC

## 2017-01-11 ENCOUNTER — Encounter: Payer: Self-pay | Admitting: Medical

## 2017-01-11 ENCOUNTER — Ambulatory Visit (INDEPENDENT_AMBULATORY_CARE_PROVIDER_SITE_OTHER): Payer: BLUE CROSS/BLUE SHIELD | Admitting: Medical

## 2017-01-11 VITALS — BP 114/61 | HR 98 | Temp 99.1°F | Resp 18 | Ht 69.0 in | Wt 171.0 lb

## 2017-01-11 DIAGNOSIS — B9689 Other specified bacterial agents as the cause of diseases classified elsewhere: Secondary | ICD-10-CM

## 2017-01-11 DIAGNOSIS — A498 Other bacterial infections of unspecified site: Secondary | ICD-10-CM

## 2017-01-11 DIAGNOSIS — Z8719 Personal history of other diseases of the digestive system: Secondary | ICD-10-CM

## 2017-01-11 DIAGNOSIS — D649 Anemia, unspecified: Secondary | ICD-10-CM | POA: Diagnosis not present

## 2017-01-11 LAB — CBC WITH DIFFERENTIAL/PLATELET
BASOS PCT: 0.9 % (ref 0.0–3.0)
Basophils Absolute: 0.1 10*3/uL (ref 0.0–0.1)
EOS PCT: 2.6 % (ref 0.0–5.0)
Eosinophils Absolute: 0.2 10*3/uL (ref 0.0–0.7)
HEMATOCRIT: 31.3 % — AB (ref 39.0–52.0)
HEMOGLOBIN: 10.2 g/dL — AB (ref 13.0–17.0)
Lymphocytes Relative: 21.3 % (ref 12.0–46.0)
Lymphs Abs: 1.6 10*3/uL (ref 0.7–4.0)
MCHC: 32.5 g/dL (ref 30.0–36.0)
MCV: 82.5 fl (ref 78.0–100.0)
MONO ABS: 1 10*3/uL (ref 0.1–1.0)
Monocytes Relative: 12.9 % — ABNORMAL HIGH (ref 3.0–12.0)
NEUTROS ABS: 4.7 10*3/uL (ref 1.4–7.7)
Neutrophils Relative %: 62.3 % (ref 43.0–77.0)
PLATELETS: 590 10*3/uL — AB (ref 150.0–400.0)
RBC: 3.8 Mil/uL — ABNORMAL LOW (ref 4.22–5.81)
RDW: 14.7 % (ref 11.5–15.5)
WBC: 7.6 10*3/uL (ref 4.0–10.5)

## 2017-01-11 NOTE — Progress Notes (Signed)
Subjective:    Patient ID: Calvin Andrews, male    DOB: January 30, 1976, 41 y.o.   MRN: [id number]  HPI  Pt in for follow up. He had colitis on CT and then stool test came pack positive for C dificile. He was given 10 days of vancomycin. Recently seen by GI.   Pt states still having some loose stools and some mild occaisonal bright red blood in stools. Some better than before but still some symptoms.  Pt states colonoscopy was canceled when placed on vancomycin.(he has about 4-5 more days of the vanco)  His follow up  GI appointment  November 1st.  Pt was slightly anemic when I checked on 12-26-2016.  Pt states at work he can't abruptly stop working. Example in 40 foot lift and was impossible to make it to bathroom. He works on Mining engineer.     Review of Systems  Constitutional: Negative for chills, fatigue and fever.  Respiratory: Negative for cough, chest tightness, shortness of breath and wheezing.   Cardiovascular: Negative for chest pain and palpitations.  Gastrointestinal: Positive for diarrhea. Negative for abdominal distention, abdominal pain, blood in stool and constipation.  Genitourinary: Negative for decreased urine volume, difficulty urinating, dysuria, frequency, hematuria, penile pain, penile swelling and scrotal swelling.  Musculoskeletal: Negative for back pain.  Neurological: Negative for dizziness, tremors, weakness, numbness and headaches.  Hematological: Negative for adenopathy. Does not bruise/bleed easily.  Psychiatric/Behavioral: Negative for behavioral problems and confusion.   Past Medical History:  Diagnosis Date  . Anemia      Social History   Social History  . Marital status: Single    Spouse name: N/A  . Number of children: N/A  . Years of education: N/A   Occupational History  . Not on file.   Social History Main Topics  . Smoking status: Current Some Day Smoker  . Smokeless tobacco: Never Used  . Alcohol use Yes  . Drug use: No  . Sexual  activity: Yes   Other Topics Concern  . Not on file   Social History Narrative  . No narrative on file    No past surgical history on file.  Family History  Problem Relation Age of Onset  . Dementia Father   . Diabetes Brother     No Known Allergies  Current Outpatient Prescriptions on File Prior to Visit  Medication Sig Dispense Refill  . vancomycin (VANCOCIN HCL) 125 MG capsule Take 1 capsule (125 mg total) by mouth 4 (four) times daily. 40 capsule 0  . dicyclomine (BENTYL) 20 MG tablet Take 1 tablet (20 mg total) by mouth 2 (two) times daily. (Patient not taking: Reported on 01/11/2017) 20 tablet 0  . metroNIDAZOLE (FLAGYL) 500 MG tablet Take 1 tablet (500 mg total) by mouth 3 (three) times daily. (Patient not taking: Reported on 01/11/2017) 30 tablet 0  . Na Sulfate-K Sulfate-Mg Sulf 17.5-3.13-1.6 GM/180ML SOLN Take 1 kit by mouth as directed. (Patient not taking: Reported on 01/11/2017) 354 mL 0   No current facility-administered medications on file prior to visit.     BP 114/61   Pulse 98   Temp 99.1 F (37.3 C) (Oral)   Resp 18   Ht '5\' 9"'$  (1.753 m)   Wt 171 lb (77.6 kg)   SpO2 98%   BMI 25.25 kg/m      Objective:   Physical Exam  General Appearance- Not in acute distress.  HEENT Eyes- Scleraeral/Conjuntiva-bilat- Not Yellow. Mouth & Throat- Normal.  Chest and  Lung Exam Auscultation: Breath sounds:-Normal. Adventitious sounds:- No Adventitious sounds.  Cardiovascular Auscultation:Rythm - Regular. Heart Sounds -Normal heart sounds.  Abdomen Inspection:-Inspection Normal.  Palpation/Perucssion: Palpation and Percussion of the abdomen reveal- Non Tender, No Rebound tenderness, No rigidity(Guarding) and No Palpable abdominal masses.  Liver:-Normal.  Spleen:- Normal.        Assessment & Plan:  For your recent C. difficile infection, I want you to continue vancomycin for the next 5 days. You're still having some intermittent loose stools at  inconvenient times.  I understand your work situation and seems to be very challenging in the event of sudden onset/desire to have BM. So I'm writing you one week work excuse. Continue  Vancomycin antibiotic, hydrate well and eat bland foods.  If by next Friday you have any loose/watery stools then let me know and I would repeat C. difficile order to make sure that the infection has cleared.  Some mild anemia in past and some bright red blood over the past 2-3 weeks with the C. difficile infection, so I want you to get a CBC today.  Follow-up in 7-10 days or as needed.  Ellora Varnum, Percell Miller, PA-C

## 2017-01-11 NOTE — Patient Instructions (Addendum)
For your recent C. difficile infection, I want you to continue vancomycin for the next 5 days. You're still having some intermittent loose stools at inconvenient times.  I understand your work situation and seems to be very challenging in the event of sudden onset/desire to have BM. So I'm writing you one week work excuse. Continue vanconycin antibiotic, hydrate well and eat bland foods.  If by next Friday you have any loose/watery stools then let me know and I would repeat C. difficile order to make sure that the infection has cleared.  Some mild anemia in past and some bright red blood over the past 2-3 weeks with the C. difficile infection, so I want you to get a CBC today.  Follow-up in 7-10 days or as needed.

## 2017-01-17 ENCOUNTER — Other Ambulatory Visit: Payer: Self-pay

## 2017-01-17 ENCOUNTER — Telehealth: Payer: Self-pay | Admitting: Physician Assistant

## 2017-01-17 MED ORDER — VANCOMYCIN HCL 125 MG PO CAPS: ORAL_CAPSULE | ORAL | 0 refills | Status: AC

## 2017-01-17 MED ORDER — DIPHENOXYLATE-ATROPINE 2.5-0.025 MG PO TABS: 1.0000 | ORAL_TABLET | Freq: Four times a day (QID) | ORAL | 0 refills | Status: AC | PRN

## 2017-01-17 NOTE — Telephone Encounter (Signed)
Was he at all improved while on abx? If so please send in prolonged Vanc taper 125 mg PO q6h x14 days, then 125 mg PO q12h X7 days, then  PO qd x7 days, then 125 mg PO q2 days for 6 weeks- thanks-JLL

## 2017-01-17 NOTE — Telephone Encounter (Signed)
Pt states he finished his vancomycin yesterday. He reports he is still having multiple watery stools with BRB in them. States he is having diarrhea about every to an hour. Please advise.

## 2017-01-17 NOTE — Telephone Encounter (Signed)
Patient states he did not notice much difference when he was taking the vancomycin. States he is really having a hard time trying to work and having to run back and forth to the bathroom. Please advise.

## 2017-01-17 NOTE — Telephone Encounter (Signed)
Please place him on prolonged Vancomycin taper as recommended last and give him Lomotil 1 tab q4-6 hours as needed. Please have him call back if this is not helping or if he has worsening abdominal pain or bleeding.  Thanks-JLL

## 2017-01-17 NOTE — Telephone Encounter (Signed)
Spoke with pt and he is aware. Script sent to pharmacy. 

## 2017-01-17 NOTE — Telephone Encounter (Signed)
Oops-can you send to [facility] please, thanks-JLL

## 2017-01-25 ENCOUNTER — Telehealth: Payer: Self-pay | Admitting: Medical

## 2017-01-25 NOTE — Telephone Encounter (Signed)
Pt [place] off documents from [employer] [place], documents placed in tray at front office

## 2017-01-30 ENCOUNTER — Encounter: Payer: BLUE CROSS/BLUE SHIELD | Admitting: Internal Medicine

## 2017-02-02 ENCOUNTER — Ambulatory Visit: Payer: BLUE CROSS/BLUE SHIELD | Admitting: Physician Assistant

## 2017-02-03 ENCOUNTER — Telehealth: Payer: Self-pay | Admitting: Medical

## 2017-02-03 NOTE — Telephone Encounter (Signed)
Filled out and signs portion of pt form today. Will you fax it today or on Monday.  Pt states he is better now. No need for further time off has been working.

## 2017-10-12 ENCOUNTER — Emergency Department (HOSPITAL_BASED_OUTPATIENT_CLINIC_OR_DEPARTMENT_OTHER): Payer: BLUE CROSS/BLUE SHIELD

## 2017-10-12 ENCOUNTER — Encounter (HOSPITAL_BASED_OUTPATIENT_CLINIC_OR_DEPARTMENT_OTHER): Payer: Self-pay | Admitting: Emergency Medicine

## 2017-10-12 ENCOUNTER — Other Ambulatory Visit: Payer: Self-pay

## 2017-10-12 ENCOUNTER — Emergency Department (HOSPITAL_BASED_OUTPATIENT_CLINIC_OR_DEPARTMENT_OTHER)
Admission: EM | Admit: 2017-10-12 | Discharge: 2017-10-12 | Disposition: A | Discharge status: Home / Self Care | Payer: BLUE CROSS/BLUE SHIELD | Attending: Emergency Medicine | Admitting: Emergency Medicine

## 2017-10-12 DIAGNOSIS — Z79899 Other long term (current) drug therapy: Secondary | ICD-10-CM | POA: Insufficient documentation

## 2017-10-12 DIAGNOSIS — Z87891 Personal history of nicotine dependence: Secondary | ICD-10-CM | POA: Insufficient documentation

## 2017-10-12 DIAGNOSIS — R0789 Other chest pain: Secondary | ICD-10-CM | POA: Diagnosis not present

## 2017-10-12 DIAGNOSIS — R079 Chest pain, unspecified: Secondary | ICD-10-CM | POA: Diagnosis not present

## 2017-10-12 LAB — BASIC METABOLIC PANEL
ANION GAP: 5 (ref 5–15)
BUN: 16 mg/dL (ref 6–20)
CO2: 28 mmol/L (ref 22–32)
Calcium: 8.5 mg/dL — ABNORMAL LOW (ref 8.9–10.3)
Chloride: 106 mmol/L (ref 98–111)
Creatinine, Ser: 0.98 mg/dL (ref 0.61–1.24)
GFR calc Af Amer: >60 mL/min (ref >60)
GFR calc non Af Amer: >60 mL/min (ref >60)
GLUCOSE: 103 mg/dL — AB (ref 70–99)
POTASSIUM: 4.3 mmol/L (ref 3.5–5.1)
Sodium: 139 mmol/L (ref 135–145)

## 2017-10-12 LAB — CBC
HEMATOCRIT: 39 % (ref 39.0–52.0)
HEMOGLOBIN: 13.4 g/dL (ref 13.0–17.0)
MCH: 26.5 pg (ref 26.0–34.0)
MCHC: 34.4 g/dL (ref 30.0–36.0)
MCV: 77.2 fL — ABNORMAL LOW (ref 78.0–100.0)
Platelets: 253 10*3/uL (ref 150–400)
RBC: 5.05 MIL/uL (ref 4.22–5.81)
RDW: 15.3 % (ref 11.5–15.5)
WBC: 6.1 10*3/uL (ref 4.0–10.5)

## 2017-10-12 LAB — TROPONIN I: Troponin I: <0.03 ng/mL (ref <0.03)

## 2017-10-12 MED ORDER — KETOROLAC TROMETHAMINE 15 MG/ML IJ SOLN
15.0000 mg | Freq: Once | INTRAMUSCULAR | Status: AC
  Administered 2017-10-12: 15 mg via INTRAVENOUS
  Filled 2017-10-12: qty 1

## 2017-10-12 MED ORDER — IBUPROFEN 800 MG PO TABS: 800.0000 mg | ORAL_TABLET | Freq: Three times a day (TID) | ORAL | 0 refills | Status: AC | PRN

## 2017-10-12 MED ORDER — GI COCKTAIL ~~LOC~~
30.0000 mL | Freq: Once | ORAL | Status: AC
  Administered 2017-10-12: 30 mL via ORAL
  Filled 2017-10-12: qty 30

## 2017-10-12 NOTE — ED Triage Notes (Signed)
Sudden onset of mid sternal chest pain last evening about 4pm that has remained constant all night.  No radiation.  Very tender to touch and increased pain with movement.

## 2017-10-12 NOTE — ED Provider Notes (Signed)
[facility] EMERGENCY DEPARTMENT Provider Note   CSN: [id number] Arrival date & time: 10/12/17  0705     History   Chief Complaint Chief Complaint  Patient presents with  . Chest Pain    HPI Calvin Andrews is a 42 y.o. male.  The history is provided by the patient. No language interpreter was used.  Chest Pain     Calvin Andrews is a 42 y.o. male who presents to the Emergency Department complaining of chest pain. He reports central chest pain that began last night. Pain is in the central chest and is nonradiating. He describes it as a dull and constant feeling, kind of like reflux. Pain is worse with movement and twisting. He denies any associated fevers, cough, shortness of breath, diaphoresis, nausea, vomiting, abdominal pain. No prior similar symptoms. 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 He denies any medical problems. He does not smoke cigarettes, drink alcohol or use drugs. Symptoms are moderate and constant nature. He has not tried any medications for his symptoms. Past Medical History:  Diagnosis Date  . Anemia     There are no active problems to display for this patient.   History reviewed. No pertinent surgical history.      Home Medications    Prior to Admission medications   Medication Sig Start Date End Date Taking? Authorizing Provider  dicyclomine (BENTYL) 20 MG tablet Take 1 tablet (20 mg total) by mouth 2 (two) times daily. Patient not taking: Reported on 01/11/2017 12/22/16   Volanda Napoleon, PA-C  diphenoxylate-atropine (LOMOTIL) 2.5-0.025 MG tablet Take 1 tablet by mouth 4 (four) times daily as needed for diarrhea or loose stools. 01/17/17   Levin Erp, PA  ibuprofen (ADVIL,MOTRIN) 800 MG tablet Take 1 tablet (800 mg total) by mouth every 8 (eight) hours as needed. 10/12/17   Quintella Reichert, MD  metroNIDAZOLE (FLAGYL) 500 MG tablet Take 1 tablet (500 mg total) by mouth 3 (three) times daily. Patient not  taking: Reported on 01/11/2017 01/02/17   Levin Erp, PA  Na Sulfate-K Sulfate-Mg Sulf 17.5-3.13-1.6 GM/180ML SOLN Take 1 kit by mouth as directed. Patient not taking: Reported on 01/11/2017 01/02/17   Levin Erp, PA  vancomycin (VANCOCIN) 125 MG capsule Take 1 tablet by mouth every 6 hours for 14 days Take 1 tablet by mouth every 12 hours for 7 days Take 1 tablet by mouth daily for 7 days Take 1 tablet by mouth every 2 days for 6 weeks 01/17/17   Levin Erp, PA    Family History Family History  Problem Relation Age of Onset  . Dementia Father   . Diabetes Brother     Social History Social History   Tobacco Use  . Smoking status: Former Research scientist (life sciences)  . Smokeless tobacco: Never Used  Substance Use Topics  . Alcohol use: Yes    Comment: occ  . Drug use: No     Allergies   Patient has no known allergies.   Review of Systems Review of Systems  Cardiovascular: Positive for chest pain.  All other systems reviewed and are negative.    Physical Exam Updated Vital Signs BP 118/83 (BP Location: Right Arm)   Pulse 72   Temp 98.5 F (36.9 C) (Oral)   Resp 16   Ht '5\' 9"'$  (1.753 m)   Wt 74.8 kg (165 lb)   SpO2 99%   BMI 24.37 kg/m   Physical Exam  Constitutional: He  is oriented to person, place, and time. He appears well-developed and well-nourished.  HENT:  Head: Normocephalic and atraumatic.  Cardiovascular: Normal rate and regular rhythm.  No murmur heard. Pulmonary/Chest: Effort normal and breath sounds normal. No respiratory distress. He exhibits tenderness.  Central chest tenderness  Abdominal: Soft. There is no tenderness. There is no rebound and no guarding.  Musculoskeletal: He exhibits no edema or tenderness.  Neurological: He is alert and oriented to person, place, and time.  5/5 strength in all four extremities.   Skin: Skin is warm and dry.  Psychiatric: He has a normal mood and affect. His behavior is normal.  Nursing note  and vitals reviewed.    ED Treatments / Results  Labs (all labs ordered are listed, but only abnormal results are displayed) Labs Reviewed  BASIC METABOLIC PANEL - Abnormal; Notable for the following components:      Result Value   Glucose, Bld 103 (*)    Calcium 8.5 (*)    All other components within normal limits  CBC - Abnormal; Notable for the following components:   MCV 77.2 (*)    All other components within normal limits  TROPONIN I    EKG EKG Interpretation  Date/Time:  Thursday October 12 2017 07:09:40 EDT Ventricular Rate:  70 PR Interval:    QRS Duration: 84 QT Interval:  397 QTC Calculation: 429 R Axis:   75 Text Interpretation:  Sinus rhythm Probable left atrial enlargement Confirmed by Quintella Reichert [phone]) on 10/12/2017 7:13:30 AM Also confirmed by Quintella Reichert [phone]), editor Lynder Parents [phone])  on 10/12/2017 8:05:16 AM   Radiology Dg Chest 2 View  Result Date: 10/12/2017 CLINICAL DATA:  Chest pain EXAM: CHEST - 2 VIEW COMPARISON:  December 22, 2016 FINDINGS: Lungs are clear. Heart size and pulmonary vascularity are normal. No adenopathy. No bone lesions. IMPRESSION: No edema or consolidation. Electronically Signed   By: Lowella Grip III M.D.   On: 10/12/2017 07:58    Procedures Procedures (including critical care time)  Medications Ordered in ED Medications  gi cocktail (Maalox,Lidocaine,Donnatal) (has no administration in time range)  ketorolac (TORADOL) 15 MG/ML injection 15 mg (has no administration in time range)     Initial Impression / Assessment and Plan / ED Course  I have reviewed the triage vital signs and the nursing notes.  Pertinent labs & imaging results that were available during my care of the patient were reviewed by me and considered in my medical decision making (see chart for details).     Patient here for evaluation of chest pain since yesterday. It is reproducible on examination and EKG without acute ischemic  changes. Current presentation is not consistent with ACS, PE, dissection. Counseled patient on home care for musculoskeletal chest pain. Discussed outpatient follow-up as well as return precautions. Final Clinical Impressions(s) / ED Diagnoses   Final diagnoses:  Chest wall pain    ED Discharge Orders        Ordered    ibuprofen (ADVIL,MOTRIN) 800 MG tablet  Every 8 hours PRN     10/12/17 0813       Quintella Reichert, MD 10/12/17 [phone]

## 2019-07-21 ENCOUNTER — Emergency Department (HOSPITAL_BASED_OUTPATIENT_CLINIC_OR_DEPARTMENT_OTHER)
Admission: EM | Admit: 2019-07-21 | Discharge: 2019-07-21 | Disposition: A | Discharge status: Home / Self Care | Payer: BC Managed Care – PPO | Attending: Emergency Medicine | Admitting: Emergency Medicine

## 2019-07-21 ENCOUNTER — Other Ambulatory Visit: Payer: Self-pay

## 2019-07-21 ENCOUNTER — Encounter (HOSPITAL_BASED_OUTPATIENT_CLINIC_OR_DEPARTMENT_OTHER): Payer: Self-pay | Admitting: Emergency Medicine

## 2019-07-21 DIAGNOSIS — M25562 Pain in left knee: Secondary | ICD-10-CM | POA: Diagnosis not present

## 2019-07-21 DIAGNOSIS — Z87891 Personal history of nicotine dependence: Secondary | ICD-10-CM | POA: Diagnosis not present

## 2019-07-21 MED ORDER — MELOXICAM 7.5 MG PO TABS: 7.5000 mg | ORAL_TABLET | Freq: Every day | ORAL | 0 refills | Status: AC

## 2019-07-21 MED ORDER — MELOXICAM 7.5 MG PO TABS: 7.5000 mg | ORAL_TABLET | Freq: Every day | ORAL | 0 refills | Status: DC

## 2019-07-21 MED ORDER — NAPROXEN 250 MG PO TABS
500.0000 mg | ORAL_TABLET | Freq: Once | ORAL | Status: AC
  Administered 2019-07-21: 500 mg via ORAL
  Filled 2019-07-21: qty 2

## 2019-07-21 NOTE — ED Triage Notes (Signed)
L knee pain x 2 days.  Denies injury.

## 2019-07-21 NOTE — Discharge Instructions (Signed)
Take meloxicam as prescribed for the next 10 days.  Take with food.  Alternate ice and heat.  Follow-up with sports medicine.

## 2019-07-21 NOTE — ED Provider Notes (Signed)
[facility] EMERGENCY DEPARTMENT Provider Note   CSN: [id number] Arrival date & time: 07/21/19  2951     History Chief Complaint  Patient presents with  . Knee Pain    Calvin Andrews is a 44 y.o. male.  44 year old male presents with complaint of medial left knee pain since waking 2 days ago.  Patient works as an Conservation officer, nature, denies injuries or increase in activity prior to pain in his knee.  Pain is worse with bearing weight or touching the medial aspect of the knee.  Denies any other joint pain, rash, history of gonorrhea.  Patient has not taken anything for his pain.  Patient reports prior injury when he was in ninth grade, possibly a hairline fracture however unclear, no other injuries.  No other complaints or concerns.        Past Medical History:  Diagnosis Date  . Anemia     There are no problems to display for this patient.   History reviewed. No pertinent surgical history.     Family History  Problem Relation Age of Onset  . Dementia Father   . Diabetes Brother     Social History   Tobacco Use  . Smoking status: Former Research scientist (life sciences)  . Smokeless tobacco: Never Used  Substance Use Topics  . Alcohol use: Yes    Comment: occ  . Drug use: No    Home Medications Prior to Admission medications   Medication Sig Start Date End Date Taking? Authorizing Provider  dicyclomine (BENTYL) 20 MG tablet Take 1 tablet (20 mg total) by mouth 2 (two) times daily. Patient not taking: Reported on 01/11/2017 12/22/16   Volanda Napoleon, PA-C  diphenoxylate-atropine (LOMOTIL) 2.5-0.025 MG tablet Take 1 tablet by mouth 4 (four) times daily as needed for diarrhea or loose stools. 01/17/17   Levin Erp, PA  ibuprofen (ADVIL,MOTRIN) 800 MG tablet Take 1 tablet (800 mg total) by mouth every 8 (eight) hours as needed. 10/12/17   Quintella Reichert, MD  meloxicam (MOBIC) 7.5 MG tablet Take 1 tablet (7.5 mg total) by mouth daily for 10 days. 07/21/19 07/31/19   Tacy Learn, PA-C  metroNIDAZOLE (FLAGYL) 500 MG tablet Take 1 tablet (500 mg total) by mouth 3 (three) times daily. Patient not taking: Reported on 01/11/2017 01/02/17   Levin Erp, PA  Na Sulfate-K Sulfate-Mg Sulf 17.5-3.13-1.6 GM/180ML SOLN Take 1 kit by mouth as directed. Patient not taking: Reported on 01/11/2017 01/02/17   Levin Erp, PA  vancomycin (VANCOCIN) 125 MG capsule Take 1 tablet by mouth every 6 hours for 14 days Take 1 tablet by mouth every 12 hours for 7 days Take 1 tablet by mouth daily for 7 days Take 1 tablet by mouth every 2 days for 6 weeks 01/17/17   Levin Erp, PA    Allergies    Patient has no known allergies.  Review of Systems   Review of Systems  Constitutional: Negative for fever.  Musculoskeletal: Positive for arthralgias, gait problem and joint swelling.  Skin: Negative for color change, rash and wound.  Allergic/Immunologic: Negative for immunocompromised state.  Neurological: Negative for weakness and numbness.  Hematological: Does not bruise/bleed easily.    Physical Exam Updated Vital Signs BP 125/72 (BP Location: Right Arm)   Pulse 99   Temp 98.1 F (36.7 C) (Oral)   Resp 20   Ht '5\' 9"'$  (1.753 m)   Wt 81.6 kg   SpO2 100%   BMI 26.58 kg/m  Physical Exam Vitals and nursing note reviewed.  Constitutional:      General: He is not in acute distress.    Appearance: He is well-developed. He is not diaphoretic.  HENT:     Head: Normocephalic and atraumatic.  Cardiovascular:     Pulses: Normal pulses.  Pulmonary:     Effort: Pulmonary effort is normal.  Musculoskeletal:        General: Tenderness present. No swelling or deformity. Normal range of motion.     Right knee: Normal.     Left knee: No swelling, deformity, effusion, erythema, lacerations, bony tenderness or crepitus. Normal range of motion. Tenderness present over the medial joint line. No lateral joint line or patellar tendon tenderness.  Normal pulse.  Skin:    General: Skin is warm and dry.     Findings: No erythema or rash.  Neurological:     Mental Status: He is alert and oriented to person, place, and time.  Psychiatric:        Behavior: Behavior normal.     ED Results / Procedures / Treatments   Labs (all labs ordered are listed, but only abnormal results are displayed) Labs Reviewed - No data to display  EKG None  Radiology No results found.  Procedures Procedures (including critical care time)  Medications Ordered in ED Medications  naproxen (NAPROSYN) tablet 500 mg (500 mg Oral Given 07/21/19 0950)    ED Course  I have reviewed the triage vital signs and the nursing notes.  Pertinent labs & imaging results that were available during my care of the patient were reviewed by me and considered in my medical decision making (see chart for details).  Clinical Course as of Jul 21 950  Sun Jul 21, 9278  2969 44 year old male with left knee pain x2 days.  On exam has medial joint line tenderness, is able to fully flex and extend the knee.  No appreciable swelling or effusion.  No history of trauma.  Suspect arthritis versus strain, recommend course of NSAIDs with follow-up with sports medicine.  Patient given naproxen here, prescription for meloxicam given with referral to sports medicine and note for work.   [LM]    Clinical Course User Index [LM] Roque Lias   MDM Rules/Calculators/A&P                     Final Clinical Impression(s) / ED Diagnoses Final diagnoses:  Acute pain of left knee    Rx / DC Orders ED Discharge Orders         Ordered    meloxicam (MOBIC) 7.5 MG tablet  Daily,   Status:  Discontinued     07/21/19 0939    meloxicam (MOBIC) 7.5 MG tablet  Daily     07/21/19 0946           Tacy Learn, PA-C 07/21/19 7573    Lennice Sites, DO 07/21/19 1154

## 2019-07-22 MED FILL — MELOXICAM 7.5 MG TABLET: 7.5 | 10 days supply | Qty: 10 | Fill #0

## 2019-07-25 ENCOUNTER — Encounter: Payer: Self-pay | Admitting: Family Medicine

## 2019-07-25 ENCOUNTER — Ambulatory Visit: Payer: Self-pay

## 2019-07-25 ENCOUNTER — Other Ambulatory Visit: Payer: Self-pay

## 2019-07-25 ENCOUNTER — Ambulatory Visit (INDEPENDENT_AMBULATORY_CARE_PROVIDER_SITE_OTHER): Payer: BC Managed Care – PPO | Admitting: Family Medicine

## 2019-07-25 VITALS — BP 123/82 | HR 88 | Ht 69.0 in | Wt 180.0 lb

## 2019-07-25 DIAGNOSIS — M25562 Pain in left knee: Secondary | ICD-10-CM | POA: Insufficient documentation

## 2019-07-25 NOTE — Progress Notes (Signed)
Calvin Andrews - 44 y.o. male MRN [id number]  Date of birth: 1975-08-05  SUBJECTIVE:  Including CC & ROS.  Chief Complaint  Patient presents with  . Knee Pain    left knee x 1 week    Calvin Andrews is a 44 y.o. male that is presenting with left knee pain.  The pain started late last week and he was seen in the emergency department.  He was provided meloxicam which is improved his symptoms.  He denies any history of injury or trauma.  No history of surgery.  Pain is over the medial aspect and seems localized to this area.  Review of Systems See HPI   HISTORY: Past Medical, Surgical, Social, and Family History Reviewed & Updated per EMR.   Pertinent Historical Findings include:  Past Medical History:  Diagnosis Date  . Anemia     No past surgical history on file.  Family History  Problem Relation Age of Onset  . Dementia Father   . Diabetes Brother     Social History   Socioeconomic History  . Marital status: Single    Spouse name: Not on file  . Number of children: Not on file  . Years of education: Not on file  . Highest education level: Not on file  Occupational History  . Not on file  Tobacco Use  . Smoking status: Former Research scientist (life sciences)  . Smokeless tobacco: Never Used  Substance and Sexual Activity  . Alcohol use: Yes    Comment: occ  . Drug use: No  . Sexual activity: Yes  Other Topics Concern  . Not on file  Social History Narrative  . Not on file   Social Determinants of Health   Financial Resource Strain:   . Difficulty of Paying Living Expenses:   Food Insecurity:   . Worried About Charity fundraiser in the Last Year:   . Arboriculturist in the Last Year:   Transportation Needs:   . Film/video editor (Medical):   Marland Kitchen Lack of Transportation (Non-Medical):   Physical Activity:   . Days of Exercise per Week:   . Minutes of Exercise per Session:   Stress:   . Feeling of Stress :   Social Connections:   . Frequency of Communication with Friends and  Family:   . Frequency of Social Gatherings with Friends and Family:   . Attends Religious Services:   . Active Member of Clubs or Organizations:   . Attends Archivist Meetings:   Marland Kitchen Marital Status:   Intimate Partner Violence:   . Fear of Current or Ex-Partner:   . Emotionally Abused:   Marland Kitchen Physically Abused:   . Sexually Abused:      PHYSICAL EXAM:  VS: BP 123/82   Pulse 88   Ht 5\' 9"  (1.753 m)   Wt 180 lb (81.6 kg)   BMI 26.58 kg/m  Physical Exam Gen: NAD, alert, cooperative with exam, well-appearing MSK:  Left knee: No effusion. Tenderness to palpation of the medial femoral condyle. No joint line tenderness. Normal strength resistance. No instability. Negative McMurray's test. Neurovascular intact  Limited ultrasound: Left knee:  No effusion in the suprapatellar pouch. Normal-appearing quadricep and patellar tendon. Normal-appearing joint space medially as well as meniscus. There is increased hyperemia that is superficial to the origin of the MCL but does not seem to be penetrating the MCL.  There is a collection of hyperemia in this area which could represent an arteriovenous malformation.  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  Ultrasound and interpretation by Clare Gandy, MD    ASSESSMENT & PLAN:   Acute pain of left knee No significant changes of the joint space and no effusion.  There is a collection of increased vascularity between the fascial layer as well as the subcutaneous.  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  Seems less likely to be involving the MCL origin.  No suggestion of occult fracture. -Counseled on supportive care. -Counseled on compression. -Have him follow-up in 2 to 3 months to reimage.  If still occurring or enlarging.  Would consider a MRI arteriogram

## 2019-07-25 NOTE — Patient Instructions (Signed)
Nice to meet you Please try ice  Please try compression   Please send me a message in MyChart with any questions or updates.  Please see me back in 2-3 months to recheck the area.   --Dr. Jordan Likes

## 2019-07-25 NOTE — Assessment & Plan Note (Signed)
No significant changes of the joint space and no effusion.  There is a collection of increased vascularity between the fascial layer as well as the subcutaneous.  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  Seems less likely to be involving the MCL origin.  No suggestion of occult fracture. -Counseled on supportive care. -Counseled on compression. -Have him follow-up in 2 to 3 months to reimage.  If still occurring or enlarging.  Would consider a MRI arteriogram

## 2019-08-04 IMAGING — DX DG ABDOMEN ACUTE W/ 1V CHEST
4 series · 4 of 4 positions shown · non-contrast
Comparison: None.

CLINICAL DATA: Abdominal pain and diarrhea.

EXAM:
DG ABDOMEN ACUTE W/ 1V CHEST

[chest pa]
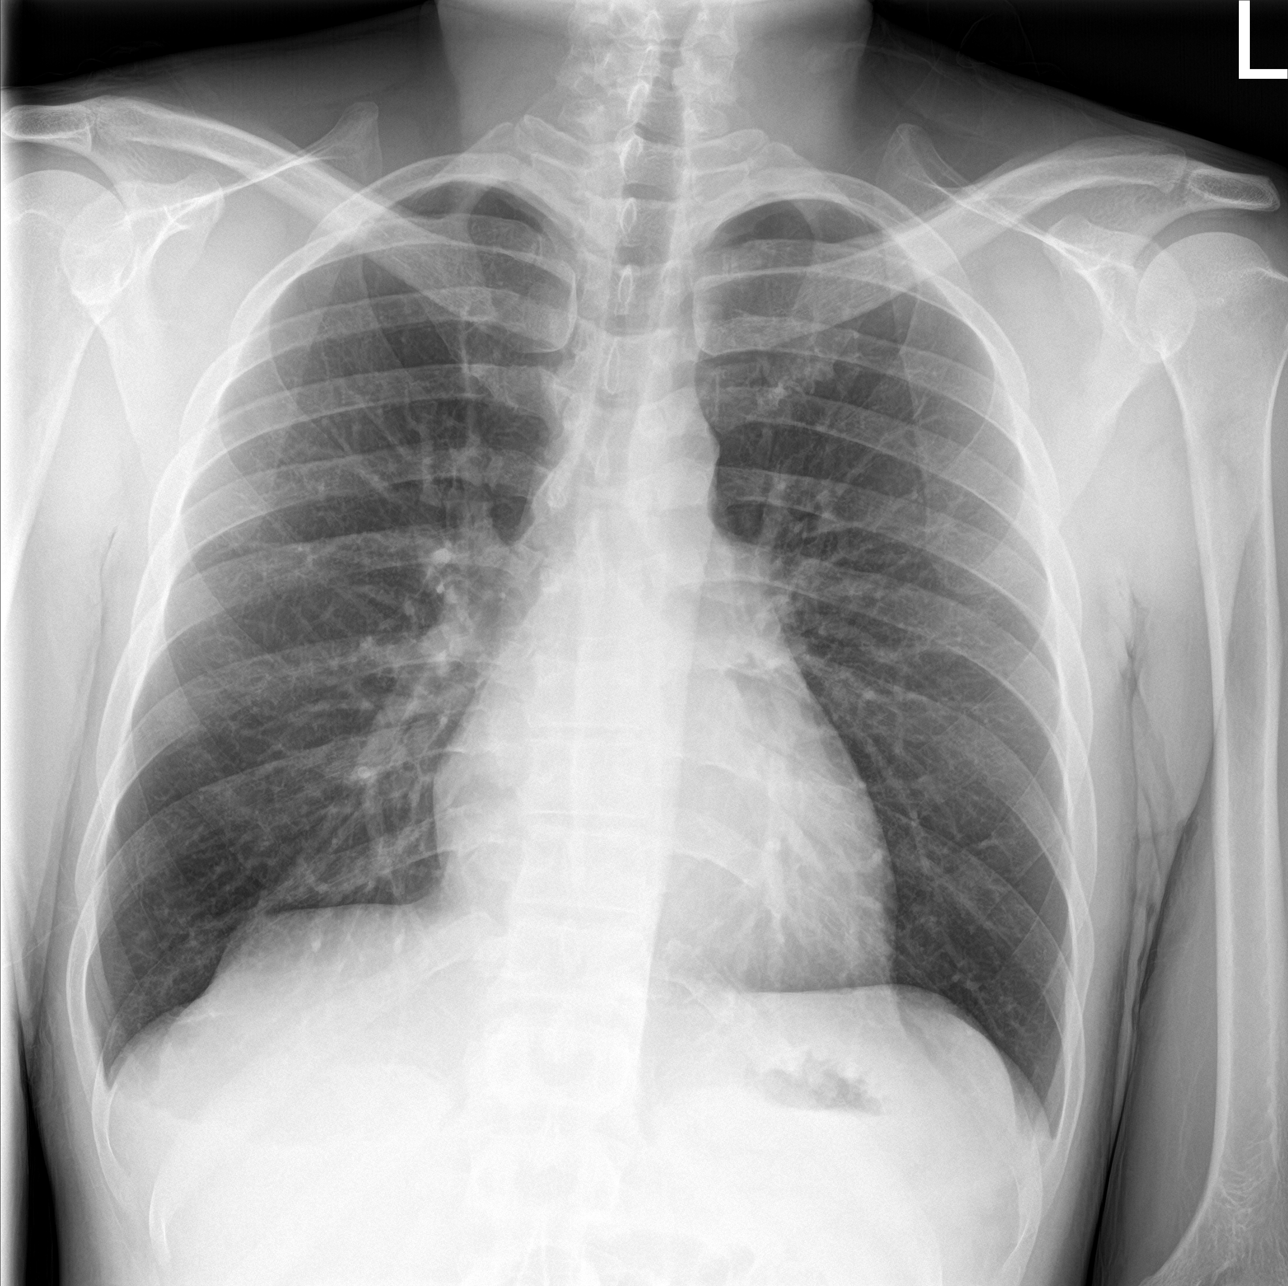

[abdomen erect]
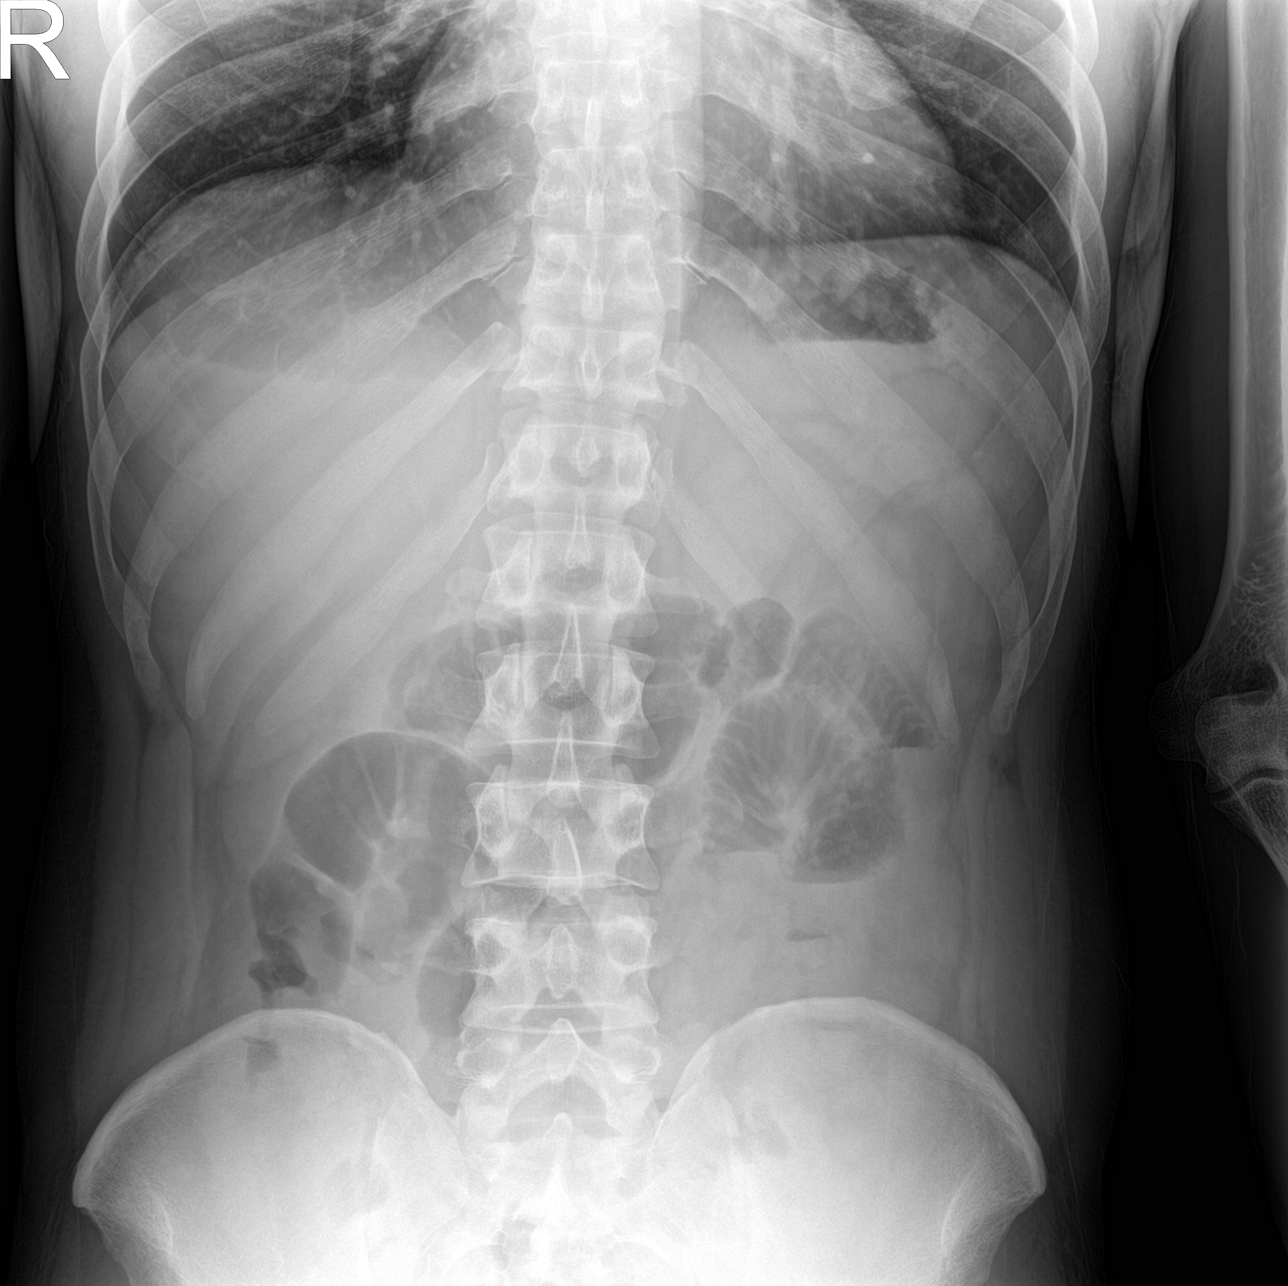

[abdomen supine (1 of 2)]
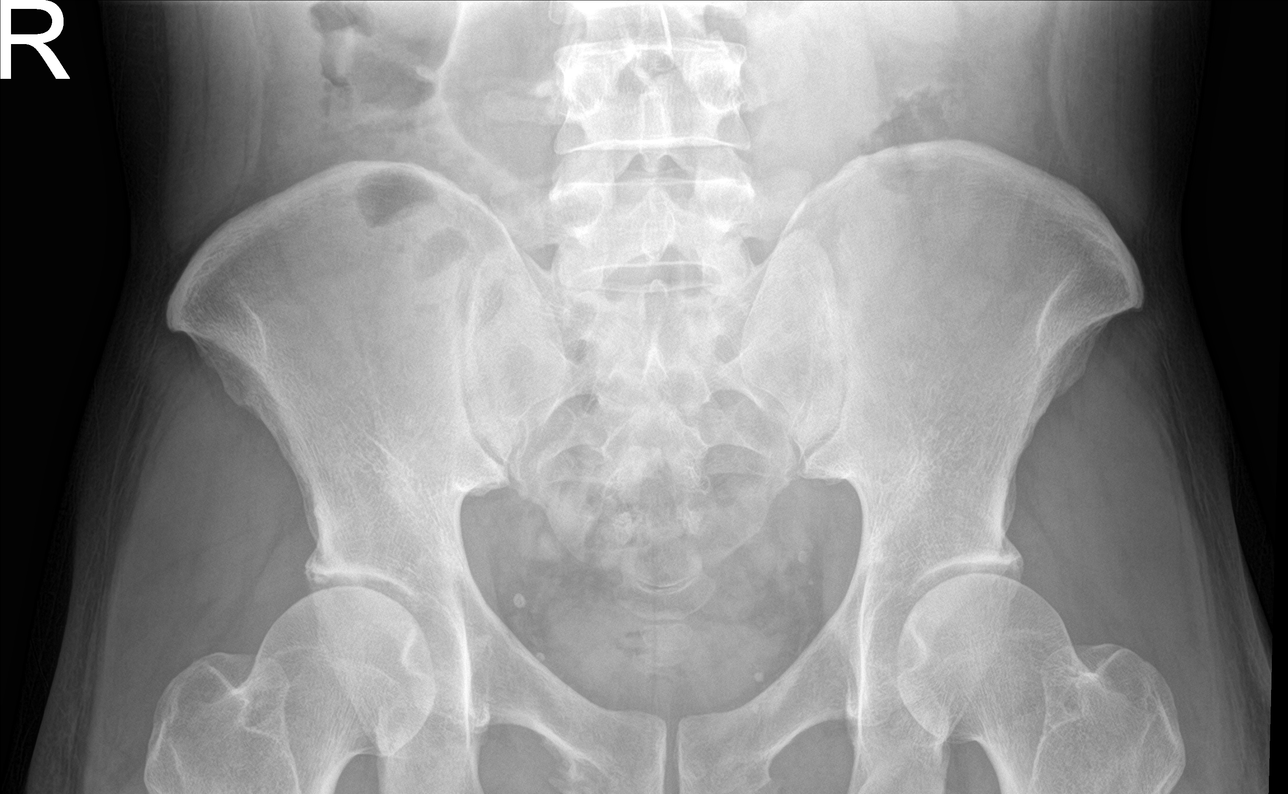

[abdomen supine (2 of 2)]
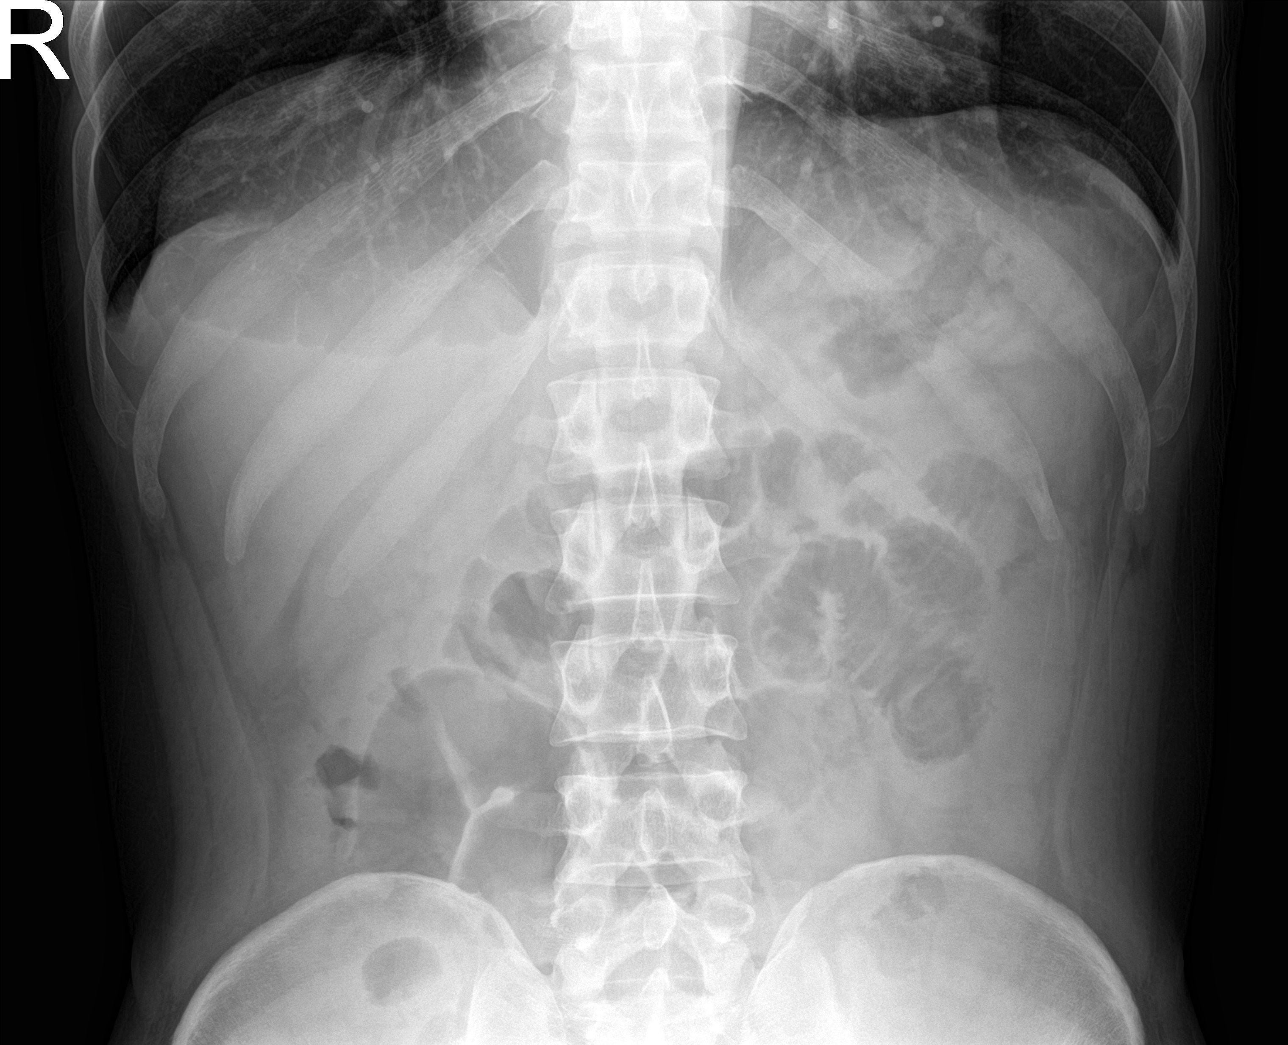

[4 of 4 positions shown; findings below may reference images not displayed]

FINDINGS: There are a few mildly dilated loops of small bowel and air-fluid
levels. No free intraperitoneal air. No radiopaque calculi or other
significant radiographic abnormality is seen.

Heart size and mediastinal contours are within normal limits. Both
lungs are clear.
IMPRESSION: 1. There are a few mildly dilated loops of small bowel and air-fluid
levels, which could reflect early small bowel obstruction. Consider
CT for further evaluation as clinically indicated.
2. No acute cardiopulmonary disease.

## 2020-05-24 IMAGING — CR DG CHEST 2V
2 series · 2 of 2 positions shown · non-contrast
Comparison: December 22, 2016

CLINICAL DATA: Chest pain

EXAM:
CHEST - 2 VIEW

[w chest pa]
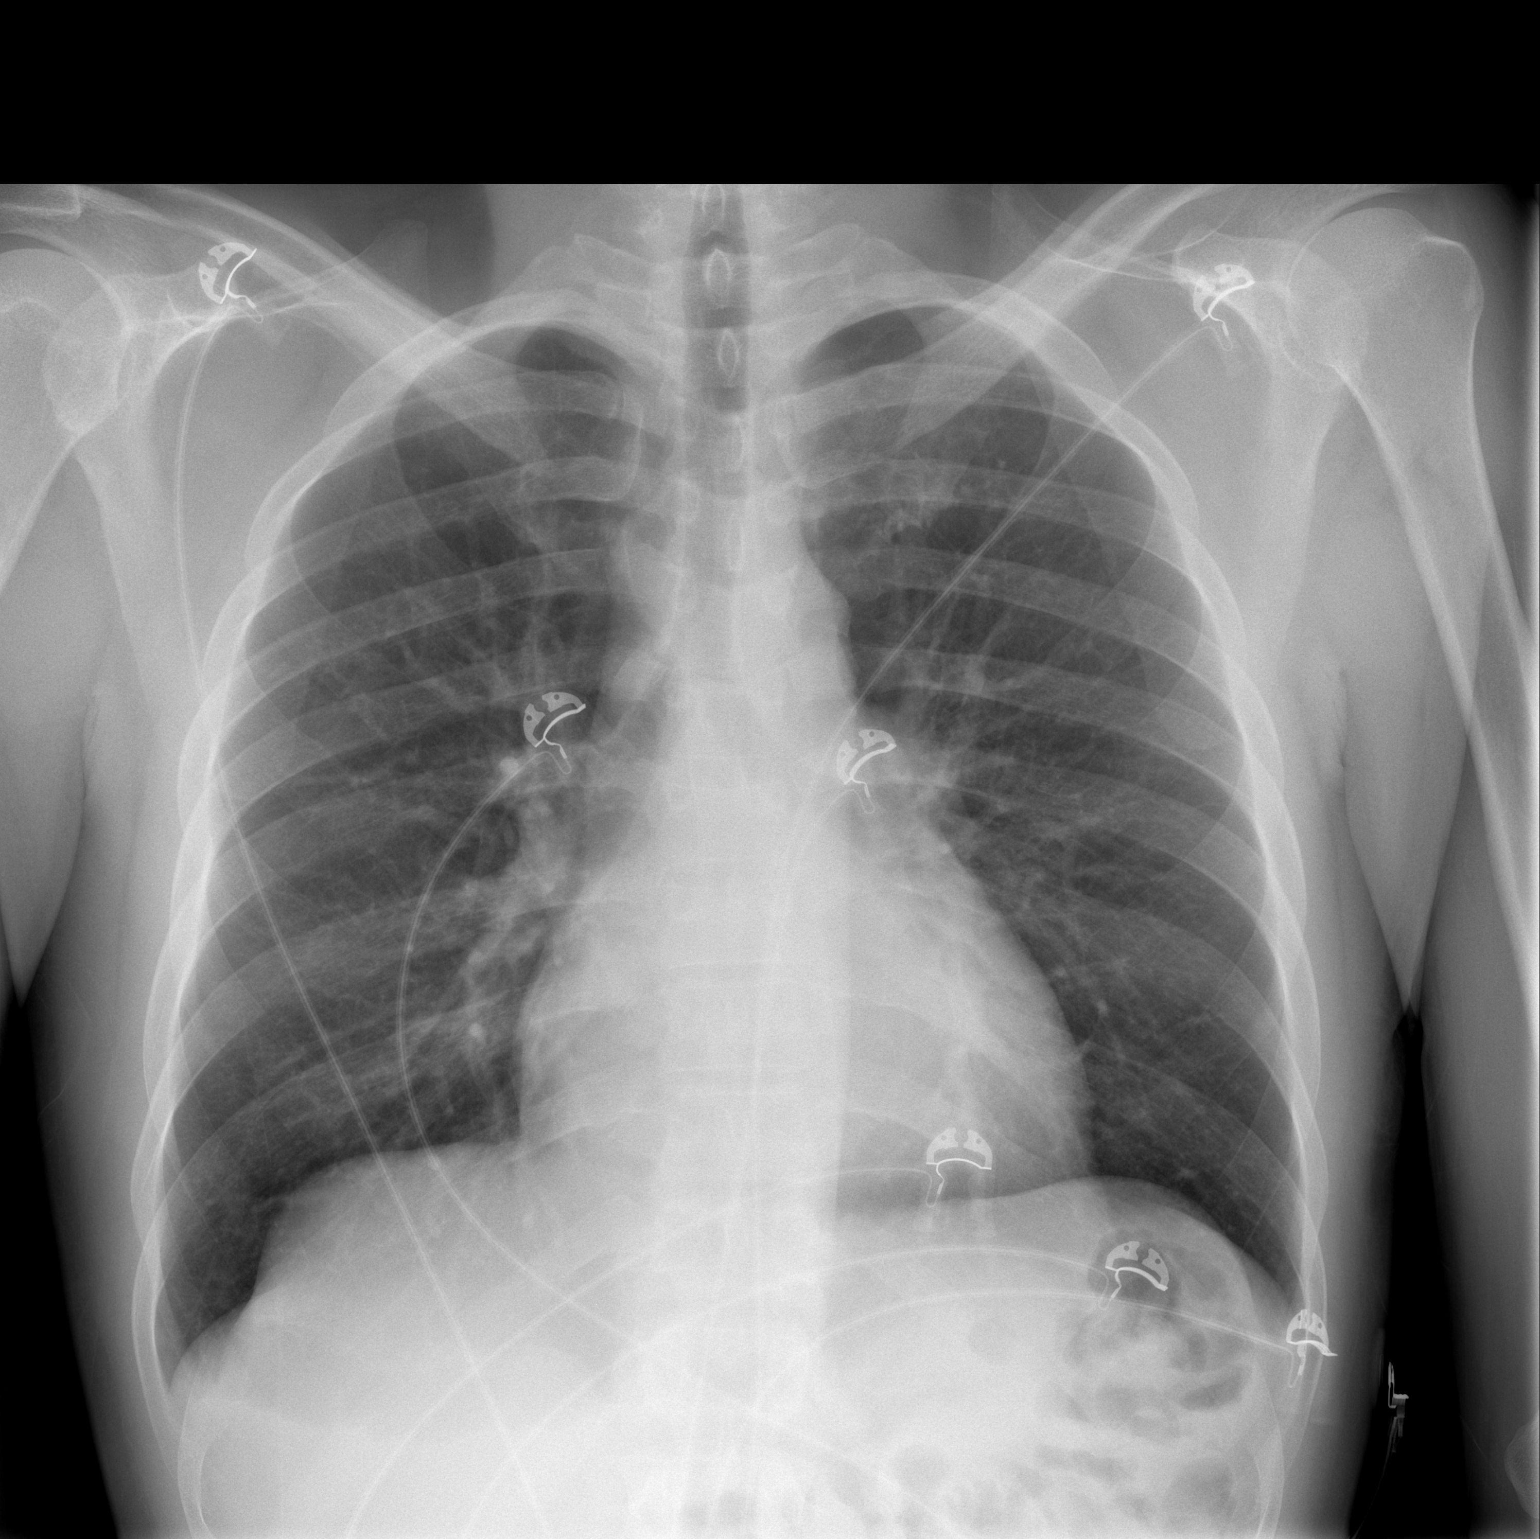

[w chest lat]
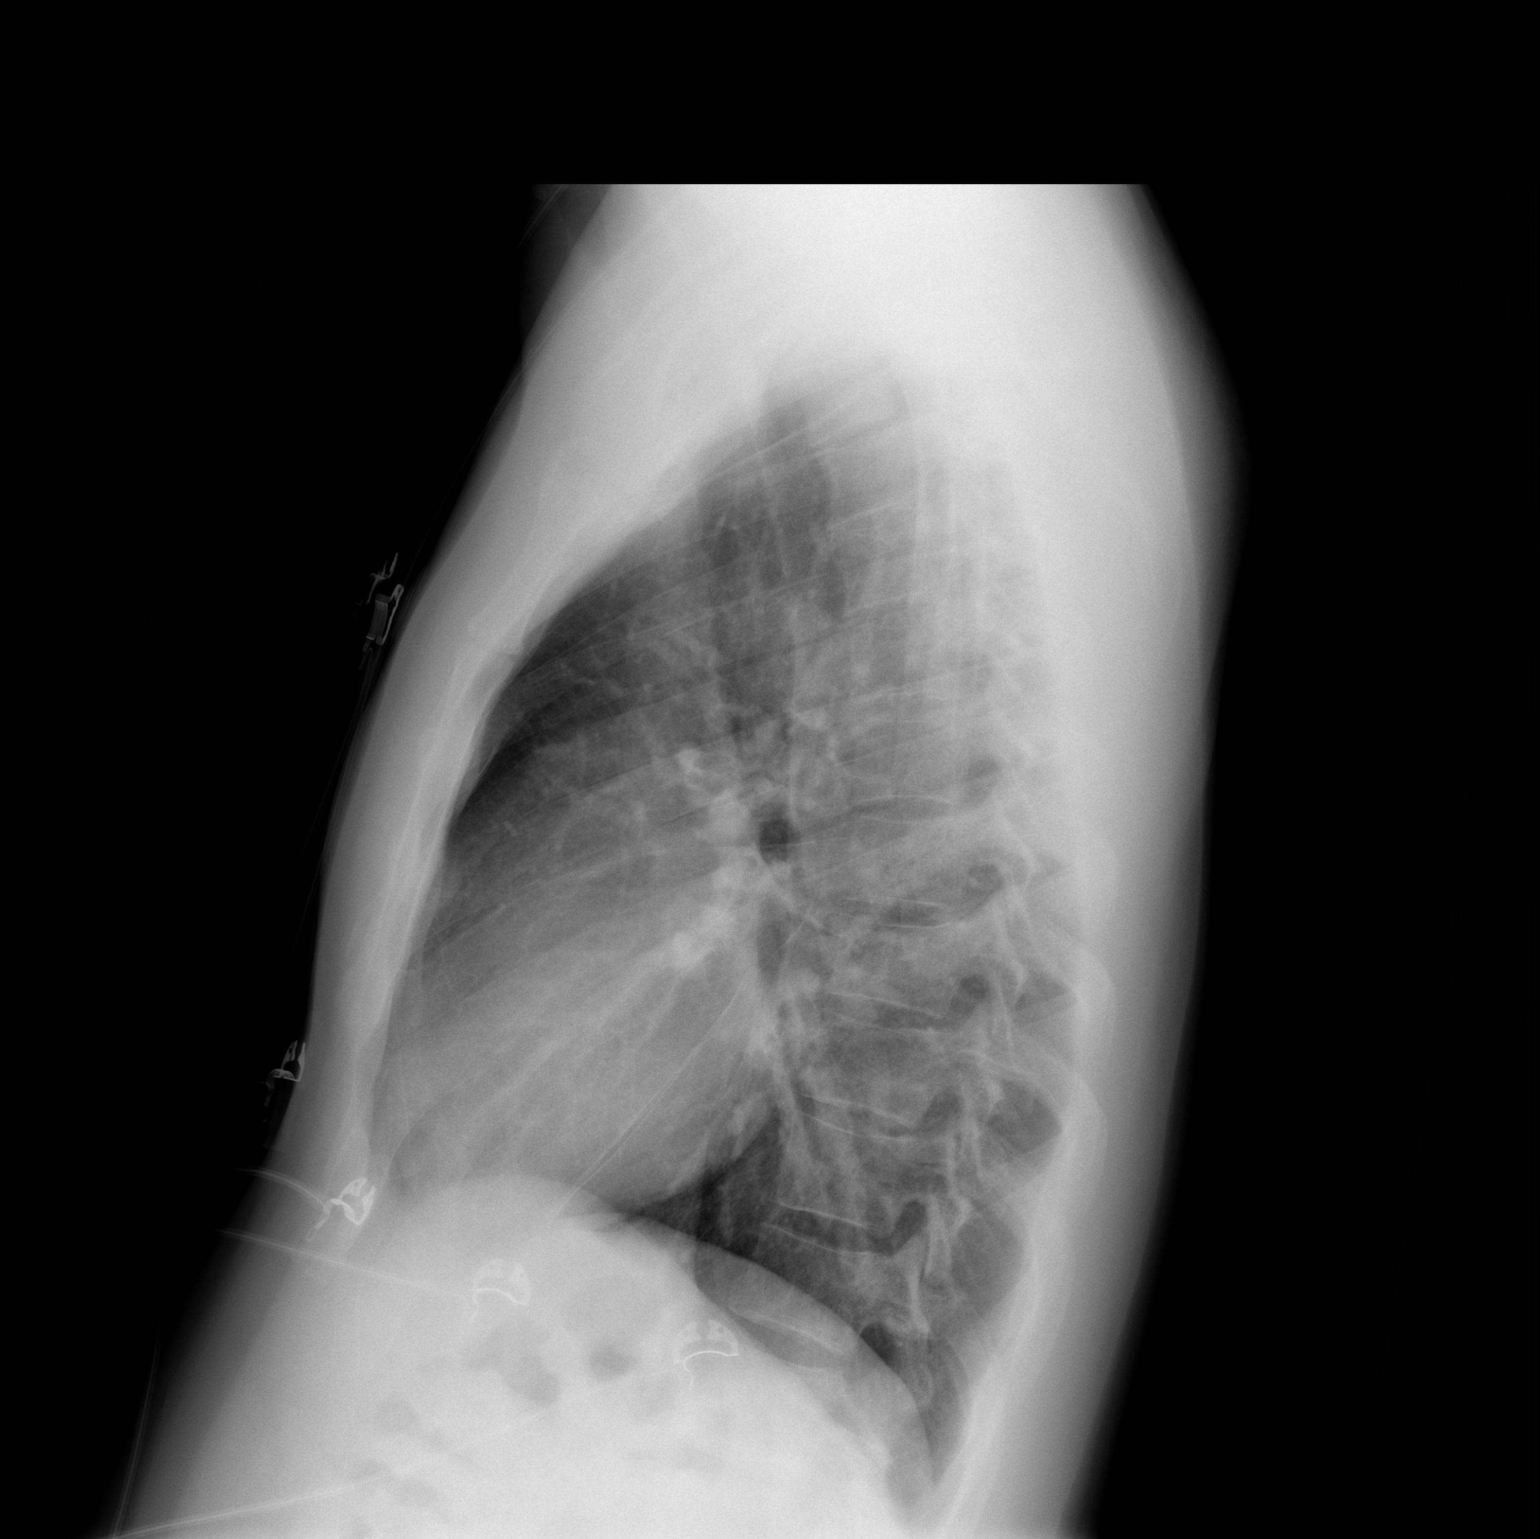

[2 of 2 positions shown; findings below may reference images not displayed]

FINDINGS: Lungs are clear. Heart size and pulmonary vascularity are normal. No
adenopathy. No bone lesions..
IMPRESSION: No edema or consolidation.

## 2022-07-20 ENCOUNTER — Encounter: Payer: Self-pay | Admitting: *Deleted
